# Patient Record
Sex: Male | Born: 1945 | Race: White | Hispanic: No | Marital: Married | Smoking: Current every day smoker
Health system: Southern US, Community
[De-identification: ages and names within clinical notes are randomized; demographics above are authoritative.]

## PROBLEM LIST (undated history)

## (undated) DIAGNOSIS — Z923 Personal history of irradiation: Secondary | ICD-10-CM

## (undated) DIAGNOSIS — E785 Hyperlipidemia, unspecified: Secondary | ICD-10-CM

## (undated) DIAGNOSIS — K5790 Diverticulosis of intestine, part unspecified, without perforation or abscess without bleeding: Secondary | ICD-10-CM

## (undated) DIAGNOSIS — T7840XA Allergy, unspecified, initial encounter: Secondary | ICD-10-CM

## (undated) DIAGNOSIS — M1990 Unspecified osteoarthritis, unspecified site: Secondary | ICD-10-CM

## (undated) DIAGNOSIS — I10 Essential (primary) hypertension: Secondary | ICD-10-CM

## (undated) DIAGNOSIS — Z9221 Personal history of antineoplastic chemotherapy: Secondary | ICD-10-CM

## (undated) DIAGNOSIS — C61 Malignant neoplasm of prostate: Secondary | ICD-10-CM

## (undated) HISTORY — DX: Allergy, unspecified, initial encounter: T78.40XA

## (undated) HISTORY — PX: MOUTH SURGERY: SHX715

## (undated) HISTORY — DX: Hyperlipidemia, unspecified: E78.5

## (undated) HISTORY — PX: REPLACEMENT TOTAL KNEE: SUR1224

## (undated) HISTORY — DX: Unspecified osteoarthritis, unspecified site: M19.90

## (undated) HISTORY — DX: Diverticulosis of intestine, part unspecified, without perforation or abscess without bleeding: K57.90

## (undated) HISTORY — DX: Personal history of antineoplastic chemotherapy: Z92.21

## (undated) HISTORY — DX: Essential (primary) hypertension: I10

## (undated) HISTORY — PX: COLONOSCOPY: SHX174

---

## 1999-05-16 ENCOUNTER — Emergency Department (HOSPITAL_COMMUNITY): Admission: EM | Admit: 1999-05-16 | Discharge: 1999-05-16 | Payer: Self-pay

## 1999-05-18 ENCOUNTER — Emergency Department (HOSPITAL_COMMUNITY): Admission: EM | Admit: 1999-05-18 | Discharge: 1999-05-18 | Payer: Self-pay | Admitting: Emergency Medicine

## 1999-05-22 ENCOUNTER — Ambulatory Visit (HOSPITAL_COMMUNITY): Admission: RE | Admit: 1999-05-22 | Discharge: 1999-05-22 | Payer: Self-pay | Admitting: Urology

## 1999-05-22 ENCOUNTER — Encounter: Payer: Self-pay | Admitting: Urology

## 2004-07-15 ENCOUNTER — Ambulatory Visit: Payer: Self-pay | Admitting: Gastroenterology

## 2004-07-26 ENCOUNTER — Ambulatory Visit: Payer: Self-pay | Admitting: Gastroenterology

## 2004-07-26 ENCOUNTER — Encounter (INDEPENDENT_AMBULATORY_CARE_PROVIDER_SITE_OTHER): Payer: Self-pay | Admitting: Specialist

## 2008-05-25 ENCOUNTER — Ambulatory Visit (HOSPITAL_COMMUNITY): Admission: RE | Admit: 2008-05-25 | Discharge: 2008-05-25 | Payer: Self-pay | Admitting: Orthopedic Surgery

## 2008-05-29 ENCOUNTER — Inpatient Hospital Stay (HOSPITAL_COMMUNITY): Admission: RE | Admit: 2008-05-29 | Discharge: 2008-06-01 | Payer: Self-pay | Admitting: Orthopedic Surgery

## 2009-07-04 ENCOUNTER — Encounter (INDEPENDENT_AMBULATORY_CARE_PROVIDER_SITE_OTHER): Payer: Self-pay | Admitting: *Deleted

## 2009-07-10 ENCOUNTER — Encounter (INDEPENDENT_AMBULATORY_CARE_PROVIDER_SITE_OTHER): Payer: Self-pay | Admitting: *Deleted

## 2009-08-02 ENCOUNTER — Encounter (INDEPENDENT_AMBULATORY_CARE_PROVIDER_SITE_OTHER): Payer: Self-pay | Admitting: *Deleted

## 2009-08-06 ENCOUNTER — Ambulatory Visit: Payer: Self-pay | Admitting: Gastroenterology

## 2009-08-20 ENCOUNTER — Ambulatory Visit: Payer: Self-pay | Admitting: Gastroenterology

## 2010-03-12 NOTE — Letter (Signed)
Summary: [facility] Instructions  [facility]  [address] [place], [place] [address]   Phone: [phone]  Fax: [phone]       Larry Rasmussen    27-May-1945    MRN: [id number]        Procedure Day Dorna Bloom:  Duanne Limerick  08/20/09     Arrival Time:  8:00AM     Procedure Time:  9:00AM     [facility] of Procedure:                    [facility]   PREPARATION FOR COLONOSCOPY WITH MOVIPREP   Starting 5 days prior to your procedure 08/15/09 do not eat nuts, seeds, popcorn, corn, beans, peas,  salads, or any raw vegetables.  Do not take any fiber supplements (e.g. Metamucil, Citrucel, and Benefiber).  THE DAY BEFORE YOUR PROCEDURE         DATE: 08/19/09  DAY: SUNDAY  1.  Drink clear liquids the entire day-NO SOLID FOOD  2.  Do not drink anything colored red or purple.  Avoid juices with pulp.  No orange juice.  3.  Drink at least 64 oz. (8 glasses) of fluid/clear liquids during the day to prevent dehydration and help the prep work efficiently.  CLEAR LIQUIDS INCLUDE: Water Jello Ice Popsicles Tea (sugar ok, no milk/cream) Powdered fruit flavored drinks Coffee (sugar ok, no milk/cream) Gatorade Juice: apple, white grape, white cranberry  Lemonade Clear bullion, consomm, broth Carbonated beverages (any kind) Strained chicken noodle soup Hard Candy                             4.  In the morning, mix first dose of MoviPrep solution:    Empty 1 Pouch A and 1 Pouch B into the disposable container    Add lukewarm drinking water to the top line of the container. Mix to dissolve    Refrigerate (mixed solution should be used within 24 hrs)  5.  Begin drinking the prep at 5:00 p.m. The MoviPrep container is divided by 4 marks.   Every 15 minutes drink the solution down to the next mark (approximately 8 oz) until the full liter is complete.   6.  Follow completed prep with 16 oz of clear liquid of your choice (Nothing red or purple).   Continue to drink clear liquids until bedtime.  7.  Before going to bed, mix second dose of MoviPrep solution:    Empty 1 Pouch A and 1 Pouch B into the disposable container    Add lukewarm drinking water to the top line of the container. Mix to dissolve    Refrigerate  THE DAY OF YOUR PROCEDURE      DATE: 08/20/09  DAY: MONDAY  Beginning at 4:00AM (5 hours before procedure):         1. Every 15 minutes, drink the solution down to the next mark (approx 8 oz) until the full liter is complete.  2. Follow completed prep with 16 oz. of clear liquid of your choice.    3. You may drink clear liquids until 7:00AM (2 HOURS BEFORE PROCEDURE).   MEDICATION INSTRUCTIONS  Unless otherwise instructed, you should take regular prescription medications with a small sip of water   as early as possible the morning of your procedure.          OTHER INSTRUCTIONS  You will need a responsible adult  at least 63 years of age to accompany you and drive you home.   This person must remain in the waiting room during your procedure.  Wear loose fitting clothing that is easily removed.  Leave jewelry and other valuables at home.  However, you may wish to bring a book to read or  an iPod/MP3 player to listen to music as you wait for your procedure to start.  Remove all body piercing jewelry and leave at home.  Total time from sign-in until discharge is approximately 2-3 hours.  You should go home directly after your procedure and rest.  You can resume normal activities the  day after your procedure.  The day of your procedure you should not:   Drive   Make legal decisions   Operate machinery   Drink alcohol   Return to work  You will receive specific instructions about eating, activities and medications before you leave.    The above instructions have been reviewed and explained to me by   Wyona Almas RN  August 06, 2009 4:31 PM     I fully understand and can verbalize these  instructions _____________________________ Date _________

## 2010-03-12 NOTE — Letter (Signed)
Summary: Colonoscopy Letter  [facility]  [address] [place], [place] [address]   Phone: [phone]  Fax: [phone]      Jul 04, 2009 MRN: [id number]   Larry Rasmussen [address] [place], [place]  [address]   Dear [name],   According to your medical record, it is time for you to schedule a Colonoscopy. The American Cancer Society recommends this procedure as a method to detect early colon cancer. Patients with a family history of colon cancer, or a personal history of colon polyps or inflammatory bowel disease are at increased risk.  This letter has beeen generated based on the recommendations made at the time of your procedure. If you feel that in your particular situation this may no longer apply, please contact our office.  Please call our office at [phone] to schedule this appointment or to update your records at your earliest convenience.  Thank you for cooperating with [place] to provide you with the very best care possible.   Sincerely,    Vania Rea. Jarold Motto, M.D.  [facility] [phone]

## 2010-03-12 NOTE — Procedures (Signed)
Summary: Colonoscopy  Patient: Zen Cedillos Note: All result statuses are Final unless otherwise noted.  Tests: (1) Colonoscopy (COL)   COL Colonoscopy           DONE     [facility]     [address]     [place], [place]  [address]           COLONOSCOPY PROCEDURE REPORT           PATIENT:  Naseer, Hearn  MR#:  [id number]     BIRTHDATE:  03/18/1945, 64 yrs. old  GENDER:  male     ENDOSCOPIST:  Vania Rea. Jarold Motto, MD, [facility]     REF. BY:     PROCEDURE DATE:  08/20/2009     PROCEDURE:  Surveillance Colonoscopy     ASA CLASS:  Class II     INDICATIONS:  history of pre-cancerous (adenomatous) colon polyps           MEDICATIONS:   Fentanyl 75 mcg IV, Versed 7 mg IV           DESCRIPTION OF PROCEDURE:   After the risks benefits and     alternatives of the procedure were thoroughly explained, informed     consent was obtained.  Digital rectal exam was performed and     revealed no abnormalities.   The LB CF-[id number] endoscope     was introduced through the anus and advanced to the cecum, which     was identified by both the appendix and ileocecal valve, without     limitations.  The quality of the prep was excellent, using     MoviPrep.  The instrument was then slowly withdrawn as the colon     was fully examined.     <<PROCEDUREIMAGES>>           FINDINGS:  Moderate diverticulosis was found in the sigmoid to     descending colon segments.  No polyps or cancers were seen.  This     was otherwise a normal examination of the colon.   Retroflexed     views in the rectum revealed no abnormalities.    The scope was     then withdrawn from the patient and the procedure completed.           COMPLICATIONS:  None     ENDOSCOPIC IMPRESSION:     1) Moderate diverticulosis in the sigmoid to descending colon     segments     2) No polyps or cancers     3) Otherwise normal examination     RECOMMENDATIONS:     1) Follow up colonoscopy in 5 years     REPEAT EXAM:   No           ______________________________     Vania Rea. Jarold Motto, MD, [facility]           CC:           n.     eSIGNED:   Vania Rea. Patterson at 08/20/2009 09:48 AM           Nile Riggs, [id number]  Note: An exclamation mark (!) indicates a result that was not dispersed into the flowsheet. Document Creation Date: 08/20/2009 9:48 AM _______________________________________________________________________  (1) Order result status: Final Collection or observation date-time: 08/20/2009 09:41 Requested date-time:  Receipt date-time:  Reported date-time:  Referring Physician:   Ordering Physician: Sheryn Bison [phone]) Specimen Source:  Source: Launa Grill Order Number: [id number] Lab  site:   Appended Document: Colonoscopy     Colonoscopy  Procedure date:  08/20/2009  Findings:      Location:  [facility].    Procedures Next Due Date:    Colonoscopy: 08/2014

## 2010-03-12 NOTE — Letter (Signed)
Summary: Previsit letter  [facility]  [address] [place], [place] [address]   Phone: [phone]  Fax: [phone]       07/10/2009 MRN: [id number]  Larry Rasmussen [address] [employer] [employer], [place]  [address]  Dear [name],  Welcome to the Gastroenterology Division at [facility].    You are scheduled to see a nurse for your pre-procedure visit on 08-06-09 at 10AM on the 3rd floor at [facility], [address]  We ask that you try to arrive at our office 15 minutes prior to your appointment time to allow for check-in.  Your nurse visit will consist of discussing your medical and surgical history, your immediate family medical history, and your medications.    Please bring a complete list of all your medications or, if you prefer, bring the medication bottles and we will list them.  We will need to be aware of both prescribed and over the counter drugs.  We will need to know exact dosage information as well.  If you are on blood thinners (Coumadin, Plavix, Aggrenox, Ticlid, etc.) please call our office today/prior to your appointment, as we need to consult with your physician about holding your medication.   Please be prepared to read and sign documents such as consent forms, a financial agreement, and acknowledgement forms.  If necessary, and with your consent, a friend or relative is welcome to sit-in on the nurse visit with you.  Please bring your insurance card so that we may make a copy of it.  If your insurance requires a referral to see a specialist, please bring your referral form from your primary care physician.  No co-pay is required for this nurse visit.     If you cannot keep your appointment, please call [phone] to cancel or reschedule prior to your appointment date.  This allows [place] the opportunity to schedule an appointment for another patient in need of care.    Thank you for choosing [facility] for your medical  needs.  We appreciate the opportunity to care for you.  Please visit [place] at our website  to learn more about our practice.                     Sincerely.                                                                                                                   The Gastroenterology Division

## 2010-03-12 NOTE — Miscellaneous (Signed)
Summary: [facility] Previsit/prep  Clinical Lists Changes  Medications: Added new medication of MOVIPREP 100 GM  SOLR (PEG-KCL-NACL-NASULF-NA ASC-C) As per prep instructions. - Signed Rx of MOVIPREP 100 GM  SOLR (PEG-KCL-NACL-NASULF-NA ASC-C) As per prep instructions.;  #1 x 0;  Signed;  Entered by: Wyona Almas RN;  Authorized by: Mardella Layman MD [facility];  Method used: [facility] [address] [phone]*, [address] [place], [place], [place]  [address], Ph: [phone] or [phone], Fax: [phone] Allergies: Added new allergy or adverse reaction of PENICILLIN Observations: Added new observation of NKA: F (08/06/2009 16:00)    Prescriptions: MOVIPREP 100 GM  SOLR (PEG-KCL-NACL-NASULF-NA ASC-C) As per prep instructions.  #1 x 0   Entered by:   Wyona Almas RN   Authorized by:   Mardella Layman MD [facility]   Signed by:   Wyona Almas RN on 08/06/2009   Method used:   [facility]        [facility] [address] [phone]* (retail)       [address]       [place], [place]  [address]       Ph: [phone] or [phone]       Fax: [phone]   RxID:   [id number]

## 2010-05-22 LAB — PROTIME-INR
INR: 1.2 (ref 0.00–1.49)
INR: 1.3 (ref 0.00–1.49)
Prothrombin Time: 15.1 seconds (ref 11.6–15.2)
Prothrombin Time: 19.8 seconds — ABNORMAL HIGH (ref 11.6–15.2)
Prothrombin Time: 13.7 seconds (ref 11.6–15.2)

## 2010-05-22 LAB — CBC
HCT: 29.7 % — ABNORMAL LOW (ref 39.0–52.0)
HCT: 30.2 % — ABNORMAL LOW (ref 39.0–52.0)
Hemoglobin: 10.6 g/dL — ABNORMAL LOW (ref 13.0–17.0)
Hemoglobin: 11.2 g/dL — ABNORMAL LOW (ref 13.0–17.0)
Hemoglobin: 15.7 g/dL (ref 13.0–17.0)
MCHC: 34.4 g/dL (ref 30.0–36.0)
MCV: 90.1 fL (ref 78.0–100.0)
Platelets: 158 10*3/uL (ref 150–400)
Platelets: 176 10*3/uL (ref 150–400)
RBC: 3.3 MIL/uL — ABNORMAL LOW (ref 4.22–5.81)
RBC: 3.55 MIL/uL — ABNORMAL LOW (ref 4.22–5.81)
RBC: 4.89 MIL/uL (ref 4.22–5.81)
RDW: 12.2 % (ref 11.5–15.5)
WBC: 5.7 10*3/uL (ref 4.0–10.5)
WBC: 6.2 10*3/uL (ref 4.0–10.5)
WBC: 6.6 10*3/uL (ref 4.0–10.5)
WBC: 6.6 10*3/uL (ref 4.0–10.5)

## 2010-05-22 LAB — URINALYSIS, ROUTINE W REFLEX MICROSCOPIC
Bilirubin Urine: NEGATIVE
Glucose, UA: NEGATIVE mg/dL
Hgb urine dipstick: NEGATIVE
Ketones, ur: NEGATIVE mg/dL
Protein, ur: NEGATIVE mg/dL

## 2010-05-22 LAB — ABO/RH
ABO/RH(D): O POS
ABO/RH(D): O POS

## 2010-05-22 LAB — COMPREHENSIVE METABOLIC PANEL
ALT: 51 U/L (ref 0–53)
AST: 27 U/L (ref 0–37)
Alkaline Phosphatase: 55 U/L (ref 39–117)
CO2: 27 mEq/L (ref 19–32)
Chloride: 104 mEq/L (ref 96–112)
GFR calc Af Amer: 57 mL/min — ABNORMAL LOW (ref >60)
GFR calc non Af Amer: 47 mL/min — ABNORMAL LOW (ref >60)
Glucose, Bld: 96 mg/dL (ref 70–99)
Sodium: 139 mEq/L (ref 135–145)
Total Bilirubin: 1.1 mg/dL (ref 0.3–1.2)

## 2010-05-22 LAB — DIFFERENTIAL
Basophils Absolute: 0 10*3/uL (ref 0.0–0.1)
Basophils Relative: 0 % (ref 0–1)
Eosinophils Absolute: 0.1 10*3/uL (ref 0.0–0.7)
Eosinophils Relative: 2 % (ref 0–5)
Neutrophils Relative %: 73 % (ref 43–77)

## 2010-05-22 LAB — BASIC METABOLIC PANEL
BUN: 20 mg/dL (ref 6–23)
CO2: 28 mEq/L (ref 19–32)
Calcium: 8.3 mg/dL — ABNORMAL LOW (ref 8.4–10.5)
Chloride: 104 mEq/L (ref 96–112)
Creatinine, Ser: 1.47 mg/dL (ref 0.4–1.5)
GFR calc Af Amer: 59 mL/min — ABNORMAL LOW (ref >60)
GFR calc non Af Amer: 49 mL/min — ABNORMAL LOW (ref >60)
Glucose, Bld: 143 mg/dL — ABNORMAL HIGH (ref 70–99)
Potassium: 4 mEq/L (ref 3.5–5.1)
Sodium: 137 mEq/L (ref 135–145)

## 2010-05-22 LAB — TYPE AND SCREEN
ABO/RH(D): O POS
ABO/RH(D): O POS
Antibody Screen: NEGATIVE

## 2010-06-25 NOTE — Op Note (Signed)
NAME:  Larry Rasmussen, Larry Rasmussen           ACCOUNT NO.:  [id number]   MEDICAL RECORD NO.:  [id number]          PATIENT TYPE:  INP   LOCATION:  0001                         FACILITY:  [facility]   PHYSICIAN:  Harvie Junior, M.D.   DATE OF BIRTH:  28-Sep-1945   DATE OF PROCEDURE:  05/29/2008  DATE OF DISCHARGE:                               OPERATIVE REPORT   PREOPERATIVE DIAGNOSIS:  End-stage degenerative joint disease right  knee.   POSTOPERATIVE DIAGNOSIS:  End-stage degenerative joint disease right  knee.   PRINCIPAL PROCEDURES:  1. Right total knee replacement with the Sigma system, size 5 femur,      size 5 tibia and a 41-mm all-poly patella, we used a 10-mm bridging      bearing.  2. Computer-assisted right total knee replacement.   SURGEON:  Harvie Junior, MD.   ASSISTANT:  Marshia Ly, PA.   ANESTHESIA:  General.   BRIEF HISTORY:  Mr. Borkenhagen is a 65 year old male with a long history  of having had significant degenerative joint disease of the right knee,  in particular on the lateral side.  He had been treated conservatively  for a long period time because of continuing complaints of significant  pain.  He was also taken to the operating room for right total knee  replacement.  Because of his young age and need for longevity of his  implant, we discussed and we are going to use computer-assisted  technology and this was chosen to be used for the case and was used  during the procedure.   PROCEDURE:  Patient taken to the operating room.  After adequate  anesthesia was obtained with general anesthetic, the patient was placed  supine on the operating room table.  The right leg was prepped and  draped in the usual sterile fashion.  Following this, the leg was  exsanguinated, a blood pressure tourniquet was inflated to 350 mmHg.  Following this, a midline incision was made in the subcutaneous tissue  down to the level of the extensor mechanism and medial parapatellar  arthrotomy was undertaken.  Once this was completed, attention was  turned towards the removal of the anterior and posterior cruciates,  medial and lateral meniscus were removed and the attention was then  turned to the computer-assisted technique where at this point the two  pins were placed in the tibia, two pins in the femur and the  registration process undertaken and adds about 30 minutes to the  surgical procedure.  At this point, attention was turned towards the  tibia which was cut perpendicular to its long axis under computer  assistance.  Following this, attention was turned towards the femur and  this was cut perpendicular to the anatomic axis.  Spacer blocks were put  in place at this point and adequate extension gap had been achieved.  Following this, attention was turned towards the femur which was sized  to a size 5, and 3 degrees of external rotation were used to account for  the internal rotation, relatively speaking, of the guide.  Once this was  placed, the attention was then turned towards  the femoral cutting blocks  where anterior and posterior cuts were made as well as chamfers,  excellent cuts were made here and good placement and fit in the front.  Once this was completed, attention was turned towards the box cut and  the box cut was made centrally at a size 5.  Attention was turned to the  tibia which was exposed and the size 5 was then hammered into place in  the tibia.  At this point, the central peg was drilled and the keel was  placed and then the 10-mm bridging bearing trial was placed and the 5  femur was placed and the lugs were drilled in the 5 and then the leg was  brought out to a full extension and came easily into full extension.  Computer assistance was used at this point to check the full extension  and we could see easy full extension.  Once that was completed, the  attention was turned to the patella which was then cut with a patellar  guide down to  a level of 14 and the 41 all-poly patella was chosen at  this point and lugs were drilled for the patella.  Lugs were redrilled  or checked for the femur.  At this point, all trial components were  removed, the knee was copiously and thoroughly irrigated with normal  saline irrigation and suctioned dry and the final components were then  cemented into place, a size 5 femur, size 5 tibia and a 10-mm bridging  bearing trial was placed and a 41 all-poly patella was put in place.  At  this point, the cement was allowed to harden, computer assistance was  checked at this point with perfect neutral long alignments with perfect  gap balance in both flexion and extension.  Once this was completed,  attention was turned towards allowing the cement to dry.  When the  cement was hardened, the excess cement was removed with a cement tool  and the final check was made with the computer assistance.  Perfect  neutral long alignments, perfect gap balance.  Once this was completed,  the tourniquet was let down, all bleeders were controlled with  electrocautery, the final 10 poly was placed and final check for the  range of motion.  A medium Hemovac drain was placed and the parapatellar  arthrotomy was closed with a 1 Vicryl running, the skin with 0 and 2-0  Vicryl and skin staples.  A surgical occlusive dressing was applied as  well as knee immobilizer.  The patient taken to the recovery room where  he was noted to be in satisfactory condition.  Estimated blood loss for  procedure was less 100 mL.      Harvie Junior, M.D.  Electronically Signed     JLG/MEDQ  D:  05/29/2008  T:  05/29/2008  Job:  [id number]

## 2010-06-28 NOTE — Discharge Summary (Signed)
Larry Rasmussen, Larry Rasmussen           ACCOUNT NO.:  [id number]   MEDICAL RECORD NO.:  [id number]          PATIENT TYPE:  INP   LOCATION:  1608                         FACILITY:  [facility]   PHYSICIAN:  Harvie Junior, M.D.   DATE OF BIRTH:  1945/07/20   DATE OF ADMISSION:  05/29/2008  DATE OF DISCHARGE:  06/01/2008                               DISCHARGE SUMMARY   ADMISSION DIAGNOSES:  1. End stage degenerative joint disease right knee.  2. Hypertension.  3. Tobacco dependence.   DISCHARGE DIAGNOSES:  1. End stage degenerative joint disease right knee.  2. Hypertension.  3. Tobacco dependence.   PROCEDURE:  Right total knee arthroplasty, computer assisted, Jodi Geralds, M.D., May 29, 2008.   HISTORY OF PRESENT ILLNESS:  Larry Rasmussen is a 65 year old male who  has a long history of right knee pain.  He has night pain and pain with  ambulation.  Standing x-rays of the right knee shows that he has bone-on-  bone DJD. He has not improved with exhaustive conservative treatment  including injection therapy, modification of activities, and medication.  Based upon his clinical and radiographic findings he is felt to be a  candidate for a right total knee arthroplasty and is admitted for this.   LABORATORY DATA:  Hemoglobin on admission was stable and is not able to  be located at this time. Hemoglobin on postop day one was 11.2,  hematocrit of 32.4. Hemoglobin on postop day two was 10.6 and on postop  day three, 10.6.  Protime was 15.1 seconds with an INR of 1.2 on the day  of discharge. On Coumadin therapy his INR was 1.6 with a protime of 19.8  seconds.  B-met on postop day #1 showed no abnormalities other than  elevated glucose of 143.   HOSPITAL COURSE:  The patient was brought to the operating room and  underwent a right total knee arthroplasty as well described in Dr.  Luiz Blare' operative note on 05/29/2008. Preoperatively he was given  Cleocin 300 mg IV and gentamicin 80 mg IV.  Postop was given clindamycin  300 mg IV q.8 hours x3 doses. A PCA morphine pump was used for pain  control.  Physical therapy was ordered for walker ambulation and  weightbearing as tolerated on the right.  IV fluids were instituted as  well.  Foley catheter was placed at time of surgery.  Postop day #1 he  complained of moderate right knee pain.  He was using the morphine.  Denied nausea and vomiting.  His Foley catheter was removed and he had  not voided when he was seen but he was able to do this later in the day.  Hemoglobin was stable at 11.2.  His B-met was within normal limits.  INR  was 1.2.  He got out of bed with physical therapy and incentive  spirometry was encouraged.  On postop day #2 he had moderate knee pain.  He is taking fluids and voiding without difficulties, progressing with  physical therapy.  His vital signs were stable, afebrile, right knee was  benign.  Hemoglobin was 10.6, INR 1.3.  His  PCA morphine pump was  discontinued and his IV was converted to a saline lock.  His dressing  was changed.  His Hemovac drain was pulled.  On postop day #3 his right  knee pain was improved.  He was progressing well with physical therapy.  He was on taking p.o. and had flatus but had not had a BM yet.  He was  afebrile and vital signs were stable.  Hemoglobin was 10.6 and INR was  1.6.  His saline lock was discontinued. He was discharged home in  improved condition. Was on a regular diet. Was instructed to ambulate  weightbearing as tolerated with a walker. He was given Percocet 5 mg prn  for pain, Robaxin 750 mg p.r.n. for spasm  and Coumadin one daily as directed per pharmacy, for 1 month for DVT  prophylaxis. He will need home health physical therapy and home health  RN for protimes and Coumadin management.  He will follow up with Dr.  Luiz Blare in 10 to 12 days in the office. Call sooner if he has any  problems.      Marshia Ly, P.A.      Harvie Junior, M.D.   Electronically Signed    JB/MEDQ  D:  07/06/2008  T:  07/06/2008  Job:  [id number]

## 2013-09-15 ENCOUNTER — Other Ambulatory Visit (HOSPITAL_COMMUNITY): Payer: Self-pay | Admitting: Urology

## 2013-09-15 DIAGNOSIS — R972 Elevated prostate specific antigen [PSA]: Secondary | ICD-10-CM

## 2013-09-29 ENCOUNTER — Encounter: Payer: Self-pay | Admitting: Gastroenterology

## 2013-10-07 ENCOUNTER — Ambulatory Visit (HOSPITAL_COMMUNITY)
Admission: RE | Admit: 2013-10-07 | Discharge: 2013-10-07 | Disposition: A | Discharge status: Home / Self Care | Payer: Medicare Other | Source: Ambulatory Visit | Attending: Urology | Admitting: Urology

## 2013-10-07 DIAGNOSIS — R599 Enlarged lymph nodes, unspecified: Secondary | ICD-10-CM | POA: Diagnosis not present

## 2013-10-07 DIAGNOSIS — R972 Elevated prostate specific antigen [PSA]: Secondary | ICD-10-CM | POA: Diagnosis not present

## 2013-10-07 LAB — CREATININE, SERUM
Creatinine, Ser: 1.46 mg/dL — ABNORMAL HIGH (ref 0.50–1.35)
GFR calc non Af Amer: 48 mL/min — ABNORMAL LOW (ref >90)
GFR, EST AFRICAN AMERICAN: 56 mL/min — AB (ref >90)

## 2013-10-07 MED ORDER — GADOBENATE DIMEGLUMINE 529 MG/ML IV SOLN
20.0000 mL | Freq: Once | INTRAVENOUS | Status: AC | PRN
  Administered 2013-10-07: 20 mL via INTRAVENOUS

## 2013-10-25 DIAGNOSIS — C61 Malignant neoplasm of prostate: Secondary | ICD-10-CM

## 2013-10-25 HISTORY — DX: Malignant neoplasm of prostate: C61

## 2013-10-25 HISTORY — PX: PROSTATE BIOPSY: SHX241

## 2013-12-14 ENCOUNTER — Encounter: Payer: Self-pay | Admitting: Radiation Oncology

## 2013-12-14 NOTE — Progress Notes (Signed)
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  If Prostate Cancer, Gleason Score is (4 + 4) and PSA is (7.26 on 03/10/13) 09/01/13 PSA 9.51  Fuller Song presented 2011 with signs/symptoms of: elevated PSA 7.4, biopsy negative  Biopsies of prostate (if applicable) revealed:  1/56/15  Vol 74 gm   Past/Anticipated interventions by urology, if any: biopsy x 2, PSA surveillance  Past/Anticipated interventions by medical oncology, if any: no  Weight changes, if any: no  Bowel/Bladder complaints, if any:  IPSS 11, freq, intermittency, weak stream, straining, nocturia x 2  Nausea/Vomiting, if any: no  Pain issues, if any:  no  SAFETY ISSUES:  Prior radiation? no  Pacemaker/ICD? no  Possible current pregnancy? na  Is the patient on methotrexate? no  Current Complaints / other details:  Married, retired Customer service manager, one child Dr Risa Grill: reasonable candidate for prostatectomy or xrt w/6 mos concurrent androgen deprivation therapy. Pt leaning towards radiation.

## 2013-12-15 ENCOUNTER — Telehealth: Payer: Self-pay | Admitting: *Deleted

## 2013-12-15 ENCOUNTER — Ambulatory Visit
Admission: RE | Admit: 2013-12-15 | Discharge: 2013-12-15 | Disposition: A | Discharge status: Home / Self Care | Payer: Medicare Other | Source: Ambulatory Visit | Attending: Radiation Oncology | Admitting: Radiation Oncology

## 2013-12-15 ENCOUNTER — Encounter: Payer: Self-pay | Admitting: Radiation Oncology

## 2013-12-15 VITALS — BP 154/87 | HR 70 | Temp 98.6°F | Resp 20 | Ht 72.0 in | Wt 262.7 lb

## 2013-12-15 DIAGNOSIS — Z510 Encounter for antineoplastic radiation therapy: Secondary | ICD-10-CM | POA: Diagnosis present

## 2013-12-15 DIAGNOSIS — C61 Malignant neoplasm of prostate: Secondary | ICD-10-CM | POA: Diagnosis not present

## 2013-12-15 HISTORY — DX: Malignant neoplasm of prostate: C61

## 2013-12-15 NOTE — Telephone Encounter (Signed)
CALLED PATIENT TO INFORM OF GOLD SEED PLACEMENT ON 01-19-14- ARRIVAL TIME - 8 AM  @ DR. GRAPEY'S OFFICE AND HIS [place] VISIT ON 02-16-14- ARRIVAL TIME - 9:30 AM, SPOKE WITH PATIENT AND HE IS AWARE OF THESE APPTS.

## 2013-12-15 NOTE — Progress Notes (Signed)
Please see the Nurse Progress Note in the MD Initial Consult Encounter for this patient. 

## 2013-12-15 NOTE — Progress Notes (Signed)
[facility] NEW PATIENT EVALUATION  Name: Larry Rasmussen MRN: [id number]  Date:   12/15/2013           DOB: 1945/10/15  Status: outpatient   CC: No primary care provider on file.  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 male who is seen today through the courtesy of Dr. Rana Snare for evaluation of his  stageT1c high risk adenocarcinoma prostate. I understand that he had a PSA of 7.4 back in 2011 and biopsies were nondiagnostic. He has been followed at the [place] and he tells me that his PSA rose to  9.51 this past 09/01/2013. Dr. Risa Grill performed a MRI scan on 10/07/2013 which showed 2 lesions in the left peripheral zone, concerning for high-grade carcinoma. One lesion was in the left base and a second in the left lateral mid gland. Ultrasound-guided biopsies on 10/25/2013 were diagnostic for Gleason 8 (4+4) involving 70% of one core from the left lateral apex and 20% of one core from left apex. He had Gleason 7 (4+3) involving 30% of one core of 2 biopsies, and 5% of one core from the left mid gland.  His gland volume was approximately 74 mL He is doing reasonably well from a GU and GI standpoint. His I PSS score was 11. He does have erectile dysfunction which improves with generic Viagra.  PREVIOUS RADIATION THERAPY: No   PAST MEDICAL HISTORY:  has a past medical history of Prostate cancer (10/25/13).     PAST SURGICAL HISTORY:  Past Surgical History  Procedure Laterality Date  . Replacement total knee    . Mouth surgery    . Prostate biopsy  10/25/13    gleason 8, vol 74 gm     FAMILY HISTORY: family history includes Cancer in his mother; Hypertension in his father; Kidney Stones in his father; Lung cancer in his father. His father died of lung cancer at age 34. His mother died of some type of malignancy in  age 47. No family history of prostate cancer.   SOCIAL HISTORY:  reports that he has been smoking Cigars.  He quit smokeless tobacco use about 25 years ago. His smokeless tobacco use included Chew. He reports that he drinks alcohol. He reports that he does not use illicit drugs. married, one child. 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   ALLERGIES: Penicillins   MEDICATIONS:  Current Outpatient Prescriptions  Medication Sig Dispense Refill  . AMLODIPINE BESYLATE PO Take 10 mg by mouth daily.    Marland Kitchen aspirin 81 MG tablet Take 81 mg by mouth daily.    . Omega-3 Fatty Acids (FISH OIL) 1000 MG CAPS Take by mouth.    . simvastatin (ZOCOR) 20 MG tablet Take 20 mg by mouth daily.    Marland Kitchen VITAMIN D, ERGOCALCIFEROL, PO Take by mouth.     No current facility-administered medications for this encounter.     REVIEW OF SYSTEMS:  Pertinent items are noted in HPI.    PHYSICAL EXAM:  height is 6' (1.829 m) and weight is 262 lb 11.2 oz (119.16 kg). His temperature is 98.6 F (37 C). His blood pressure is 154/87 and his pulse is 70. His respiration is 20.   Head and neck examination: Grossly unremarkable. Nodes: Without palpable cervical or supraclavicular lymphadenopathy. Chest: Lungs clear. Back:  Without spinal or CVA tenderness. Abdomen: Without hepatomegaly. Rectal: The prostate gland is slightly enlarged and is without focal induration or nodularity. Extremities: Without edema.   LABORATORY DATA:  Lab Results  Component Value Date   WBC 6.2 06/01/2008   HGB 10.6* 06/01/2008   HCT 29.7* 06/01/2008   MCV 90.1 06/01/2008   PLT 176 06/01/2008   Lab Results  Component Value Date   NA 137 05/30/2008   K 4.0 05/30/2008   CL 104 05/30/2008   CO2 28 05/30/2008   Lab Results  Component Value Date   ALT 51 05/25/2008   AST 27 05/25/2008   ALKPHOS 55 05/25/2008   BILITOT 1.1 05/25/2008   PSA 9.51 from 09/01/2013   IMPRESSION: stage T1c high risk adenocarcinoma prostate. I explained to  the patient and his wife that his prognosis is related to his stage, PSA level, and Gleason score. His PSA level and stage are favorable while his Gleason score of 8 is distinctly unfavorable. We discussed surgery versus radiation therapy. Radiation therapy options include 5 weeks of external beam followed by seed implantation or 8 weeks of external beam/IMRT. We discussed the potential acute and late toxicities of radiation therapy. We also discussed the need for androgen deprivation therapy for 2 years with either radiation therapy option. We discussed bladder filling to minimize urinary toxicity. After lengthy discussion he is most interested in external beam/IMRT which I think would be a good choice for him. He would like to avoid surgery. I will call Dr. Cy Blamer office for initiation of androgen deprivation therapy and then get him scheduled for placement of 3 gold seed markers sometime within the next 2 months. I will see him for a follow-up visit in 2 months.   PLAN: as discussed above.  I spent 60  minutes face to face with the patient and more than 50% of that time was spent in counseling and/or coordination of care.

## 2014-02-16 ENCOUNTER — Encounter: Payer: Self-pay | Admitting: Radiation Oncology

## 2014-02-16 ENCOUNTER — Ambulatory Visit
Admission: RE | Admit: 2014-02-16 | Discharge: 2014-02-16 | Disposition: A | Discharge status: Home / Self Care | Payer: Medicare Other | Source: Ambulatory Visit | Attending: Radiation Oncology | Admitting: Radiation Oncology

## 2014-02-16 VITALS — BP 160/76 | HR 60 | Temp 98.2°F | Ht 72.0 in | Wt 259.9 lb

## 2014-02-16 DIAGNOSIS — Z510 Encounter for antineoplastic radiation therapy: Secondary | ICD-10-CM | POA: Diagnosis not present

## 2014-02-16 DIAGNOSIS — C61 Malignant neoplasm of prostate: Secondary | ICD-10-CM | POA: Insufficient documentation

## 2014-02-16 NOTE — Addendum Note (Signed)
Encounter addended by: Jenene Slicker, RN on: 02/16/2014 11:59 AM<BR>     Documentation filed: Inpatient Document Flowsheet

## 2014-02-16 NOTE — Progress Notes (Signed)
Larry Rasmussen had Gold Seed Markers  Placed on 01/19/14 and has received 2 Anti-androgen injections on 12/16/13 and 01/19/14.  Next injection will be on 02/21/14.

## 2014-02-16 NOTE — Addendum Note (Signed)
Encounter addended by: Deirdre Evener, RN on: 02/16/2014  6:16 PM<BR>     Documentation filed: Charges VN

## 2014-02-16 NOTE — Progress Notes (Signed)
CC: Dr. Rana Snare  Follow-up note:  Diagnosis: Stage TIc high-risk adenocarcinoma prostate  History: Larry Rasmussen is a most pleasant 69 year old male who is seen today for review and scheduling of his radiation therapy in the management of his stage TIc high-risk adenocarcinoma prostate.  I first saw the patient in consultation on 12/15/2013. I understand that he had a PSA of 7.4 back in 2011 and biopsies were nondiagnostic. He has been followed at the [place] and he tells me that his PSA rose to 9.51 this past 09/01/2013. Dr. Risa Grill performed a MRI scan on 10/07/2013 which showed 2 lesions in the left peripheral zone, concerning for high-grade carcinoma. One lesion was in the left base and a second in the left lateral mid gland. Ultrasound-guided biopsies on 10/25/2013 were diagnostic for Gleason 8 (4+4) involving 70% of one core from the left lateral apex and 20% of one core from left apex. He had Gleason 7 (4+3) involving 30% of one core of 2 biopsies, and 5% of one core from the left mid gland. His gland volume was approximately 74 mL He is doing reasonably well from a GU and GI standpoint.  His I PSS score today remains at 11.  He does have erectile dysfunction.  He began androgen deprivation therapy on November 6.  His next injection is on January 16.  He had gold seeds placed on December 10.  He does report hot flashes but only minimal fatigue.  As expected, he has lost his sex drive.  Physical examination: Alert and oriented. Filed Vitals:   02/16/14 0927  BP: 160/76  Pulse: 60  Temp: 98.2 F (36.8 C)   Rectal examination not performed today.  Impression: Stage TIc high-risk adenocarcinoma prostate.  We again discussed management options.  We discussed the CT simulation process and my desire to have a comfortably full bladder to minimize urinary toxicity.  We discussed the potential acute and late toxicities of radiation therapy, and consent is signed today.  We'll have him return later  this month for CT simulation and begin his radiation therapy in early February.  Plan: As above.  30 minutes was spent face-to-face with the patient, primarily counseling the patient and coordinating his care.

## 2014-03-02 ENCOUNTER — Ambulatory Visit
Admission: RE | Admit: 2014-03-02 | Discharge: 2014-03-02 | Disposition: A | Discharge status: Home / Self Care | Payer: Medicare Other | Source: Ambulatory Visit | Attending: Radiation Oncology | Admitting: Radiation Oncology

## 2014-03-02 DIAGNOSIS — C61 Malignant neoplasm of prostate: Secondary | ICD-10-CM

## 2014-03-02 DIAGNOSIS — Z510 Encounter for antineoplastic radiation therapy: Secondary | ICD-10-CM | POA: Diagnosis not present

## 2014-03-02 NOTE — Progress Notes (Signed)
Complex simulation/treatment planning note: The patient was taken to the CT simulator.  He was placed supine.  A Vac lock immobilization device was constructed.  A red rubber tube was placed within the rectal vault.  He was then catheterized and contrast instilled into the bladder/urethra.  He was then scanned.  I chose an isocenter along the center of the prostate.  The CT data set was sent to the  MIM planning system I contoured his prostate, seminal vesicles, bladder, rectum, and lower rectosigmoid colon.  I'm prescribing 7800 cGy in 40 sessions to his prostate PTV which represents the prostate +0.8 cm except for 0.5 cm along the rectum.  I prescribing 5600 cGy in 40 sessions to his seminal vesicle PTV which  represents the seminal vesicles +0.5 cm.  He is now ready for IMRT simulation/treatment planning.

## 2014-03-06 ENCOUNTER — Encounter: Payer: Self-pay | Admitting: Radiation Oncology

## 2014-03-06 DIAGNOSIS — Z510 Encounter for antineoplastic radiation therapy: Secondary | ICD-10-CM | POA: Diagnosis not present

## 2014-03-06 NOTE — Progress Notes (Signed)
IMRT simulation/treatment planning note: The patient completed his IMRT simulation/treatment planning in the management of his carcinoma the prostate.  IMRT was chosen to decrease the risk for both acute and late bladder and rectal toxicity compared to conventional or 3-D conformal radiation therapy.  Dose volume histograms were obtained for the target structures including the prostate and seminal vesicles in addition to avoidance structures including the bladder, rectum, and femoral heads.  We met our departmental guidelines.  He is being treated with 6 MV photons, dual VMAT IMRT.

## 2014-03-07 DIAGNOSIS — Z510 Encounter for antineoplastic radiation therapy: Secondary | ICD-10-CM | POA: Diagnosis not present

## 2014-03-13 ENCOUNTER — Ambulatory Visit
Admission: RE | Admit: 2014-03-13 | Discharge: 2014-03-13 | Disposition: A | Discharge status: Home / Self Care | Payer: Medicare Other | Source: Ambulatory Visit | Attending: Radiation Oncology | Admitting: Radiation Oncology

## 2014-03-13 ENCOUNTER — Ambulatory Visit
Admission: RE | Admit: 2014-03-13 | Discharge: 2014-03-13 | Disposition: A | Discharge status: Home / Self Care | Payer: Non-veteran care | Source: Ambulatory Visit | Attending: Radiation Oncology | Admitting: Radiation Oncology

## 2014-03-13 ENCOUNTER — Encounter: Payer: Self-pay | Admitting: Radiation Oncology

## 2014-03-13 VITALS — BP 169/89 | HR 60 | Temp 98.2°F | Resp 12 | Wt 262.3 lb

## 2014-03-13 DIAGNOSIS — C61 Malignant neoplasm of prostate: Secondary | ICD-10-CM

## 2014-03-13 DIAGNOSIS — Z510 Encounter for antineoplastic radiation therapy: Secondary | ICD-10-CM | POA: Diagnosis not present

## 2014-03-13 NOTE — Progress Notes (Signed)
He is currently in no pain.  Pt complains of loss of sleep Pt reports urinary frequency, hesistency and hot flashes. Pt states they urinate 3 - 4 times per night.  Pt reports soft bowel movement everyday. BP 169/89 mmHg  Pulse 60  Temp(Src) 98.2 F (36.8 C) (Oral)  Resp 12  Wt 262 lb 4.8 oz (118.978 kg)  SpO2 100%

## 2014-03-13 NOTE — Progress Notes (Signed)
Chart note: Mr. Sawa began his VMAT IMRT today in the management of his prostate cancer.  He is being treated with dual ARC/dynamic MLCs corresponding to one set of IMRT treatment devices [phone]).

## 2014-03-13 NOTE — Progress Notes (Signed)
Weekly Management Note:  Site: Prostate  Current Dose:  195  cGy Projected Dose: 7800  cGy  Narrative: The patient is seen today for routine under treatment assessment. CBCT/MVCT images/port films were reviewed. The chart was reviewed.   Bladder filling is satisfactory.  No new GU or GI difficulties.  Physical Examination:  Filed Vitals:   03/13/14 1100  BP: 169/89  Pulse: 60  Temp: 98.2 F (36.8 C)  Resp: 12  .  Weight: 262 lb 4.8 oz (118.978 kg).  No change.  Impression: Tolerating radiation therapy well.  Plan: Continue radiation therapy as planned.

## 2014-03-14 ENCOUNTER — Ambulatory Visit
Admission: RE | Admit: 2014-03-14 | Discharge: 2014-03-14 | Disposition: A | Discharge status: Home / Self Care | Payer: Medicare Other | Source: Ambulatory Visit | Attending: Radiation Oncology | Admitting: Radiation Oncology

## 2014-03-14 DIAGNOSIS — Z510 Encounter for antineoplastic radiation therapy: Secondary | ICD-10-CM | POA: Diagnosis not present

## 2014-03-15 ENCOUNTER — Ambulatory Visit
Admission: RE | Admit: 2014-03-15 | Discharge: 2014-03-15 | Disposition: A | Discharge status: Home / Self Care | Payer: Medicare Other | Source: Ambulatory Visit | Attending: Radiation Oncology | Admitting: Radiation Oncology

## 2014-03-15 DIAGNOSIS — Z510 Encounter for antineoplastic radiation therapy: Secondary | ICD-10-CM | POA: Diagnosis not present

## 2014-03-16 ENCOUNTER — Ambulatory Visit
Admission: RE | Admit: 2014-03-16 | Discharge: 2014-03-16 | Disposition: A | Discharge status: Home / Self Care | Payer: Medicare Other | Source: Ambulatory Visit | Attending: Radiation Oncology | Admitting: Radiation Oncology

## 2014-03-16 DIAGNOSIS — Z510 Encounter for antineoplastic radiation therapy: Secondary | ICD-10-CM | POA: Diagnosis not present

## 2014-03-17 ENCOUNTER — Ambulatory Visit
Admission: RE | Admit: 2014-03-17 | Discharge: 2014-03-17 | Disposition: A | Discharge status: Home / Self Care | Payer: Medicare Other | Source: Ambulatory Visit | Attending: Radiation Oncology | Admitting: Radiation Oncology

## 2014-03-17 DIAGNOSIS — Z510 Encounter for antineoplastic radiation therapy: Secondary | ICD-10-CM | POA: Diagnosis not present

## 2014-03-20 ENCOUNTER — Ambulatory Visit
Admission: RE | Admit: 2014-03-20 | Discharge: 2014-03-20 | Disposition: A | Discharge status: Home / Self Care | Payer: Medicare Other | Source: Ambulatory Visit | Attending: Radiation Oncology | Admitting: Radiation Oncology

## 2014-03-20 ENCOUNTER — Encounter: Payer: Self-pay | Admitting: Radiation Oncology

## 2014-03-20 VITALS — BP 133/77 | HR 69 | Temp 98.6°F | Ht 72.0 in | Wt 265.9 lb

## 2014-03-20 DIAGNOSIS — Z510 Encounter for antineoplastic radiation therapy: Secondary | ICD-10-CM | POA: Diagnosis not present

## 2014-03-20 DIAGNOSIS — C61 Malignant neoplasm of prostate: Secondary | ICD-10-CM

## 2014-03-20 MED ORDER — TAMSULOSIN HCL 0.4 MG PO CAPS: 0.4000 mg | ORAL_CAPSULE | Freq: Every day | ORAL | Status: AC

## 2014-03-20 NOTE — Progress Notes (Signed)
Weekly Management Note:  Site: Prostate Current Dose:  1170  cGy Projected Dose: 7800  cGy  Narrative: The patient is seen today for routine under treatment assessment. CBCT/MVCT images/port films were reviewed. The chart was reviewed.   Bladder filling is satisfactory.  He does report worsening urinary frequency with some slowing of his stream.  Mild burning on initiation of his urinary stream.  Otherwise no significant GU or GI difficulties.  He does have mild fatigue.  Physical Examination:  Filed Vitals:   03/20/14 1037  BP: 133/77  Pulse: 69  Temp: 98.6 F (37 C)  .  Weight: 265 lb 14.4 oz (120.611 kg).  No change.  Impression: Tolerating radiation therapy well, however, he does have some slowing of the stream, I think he would benefit from tamsulosin.  Plan: Continue radiation therapy as planned.  Start tamsulosin.

## 2014-03-20 NOTE — Progress Notes (Signed)
Larry Rasmussen has received 6 fractions to his pelvis for prostate cancer.  He reports that he is experiencing burning upon urination and frequent nocturia ~ 5 times nightly. He states he feels as if he is emptying completely and does not have any leakage after voiding.  Reports that urine clear.   Denies change in bowel habits and denies proctitis. He admits to "some" fatigue.  Continues to walk 2 miles daily.

## 2014-03-21 ENCOUNTER — Ambulatory Visit
Admission: RE | Admit: 2014-03-21 | Discharge: 2014-03-21 | Disposition: A | Discharge status: Home / Self Care | Payer: Medicare Other | Source: Ambulatory Visit | Attending: Radiation Oncology | Admitting: Radiation Oncology

## 2014-03-21 DIAGNOSIS — Z510 Encounter for antineoplastic radiation therapy: Secondary | ICD-10-CM | POA: Diagnosis not present

## 2014-03-22 ENCOUNTER — Ambulatory Visit
Admission: RE | Admit: 2014-03-22 | Discharge: 2014-03-22 | Disposition: A | Discharge status: Home / Self Care | Payer: Medicare Other | Source: Ambulatory Visit | Attending: Radiation Oncology | Admitting: Radiation Oncology

## 2014-03-22 DIAGNOSIS — Z510 Encounter for antineoplastic radiation therapy: Secondary | ICD-10-CM | POA: Diagnosis not present

## 2014-03-23 ENCOUNTER — Ambulatory Visit
Admission: RE | Admit: 2014-03-23 | Discharge: 2014-03-23 | Disposition: A | Discharge status: Home / Self Care | Payer: Medicare Other | Source: Ambulatory Visit | Attending: Radiation Oncology | Admitting: Radiation Oncology

## 2014-03-23 DIAGNOSIS — Z510 Encounter for antineoplastic radiation therapy: Secondary | ICD-10-CM | POA: Diagnosis not present

## 2014-03-24 ENCOUNTER — Ambulatory Visit
Admission: RE | Admit: 2014-03-24 | Discharge: 2014-03-24 | Disposition: A | Discharge status: Home / Self Care | Payer: Medicare Other | Source: Ambulatory Visit | Attending: Radiation Oncology | Admitting: Radiation Oncology

## 2014-03-24 DIAGNOSIS — Z510 Encounter for antineoplastic radiation therapy: Secondary | ICD-10-CM | POA: Diagnosis not present

## 2014-03-27 ENCOUNTER — Ambulatory Visit
Admission: RE | Admit: 2014-03-27 | Discharge: 2014-03-27 | Disposition: A | Discharge status: Home / Self Care | Payer: Non-veteran care | Source: Ambulatory Visit | Attending: Radiation Oncology | Admitting: Radiation Oncology

## 2014-03-27 ENCOUNTER — Ambulatory Visit
Admission: RE | Admit: 2014-03-27 | Discharge: 2014-03-27 | Disposition: A | Discharge status: Home / Self Care | Payer: Medicare Other | Source: Ambulatory Visit | Attending: Radiation Oncology | Admitting: Radiation Oncology

## 2014-03-27 ENCOUNTER — Encounter: Payer: Self-pay | Admitting: Radiation Oncology

## 2014-03-27 VITALS — BP 136/71 | HR 72 | Temp 97.8°F | Resp 20 | Wt 266.6 lb

## 2014-03-27 DIAGNOSIS — C61 Malignant neoplasm of prostate: Secondary | ICD-10-CM

## 2014-03-27 DIAGNOSIS — Z510 Encounter for antineoplastic radiation therapy: Secondary | ICD-10-CM | POA: Diagnosis not present

## 2014-03-27 NOTE — Progress Notes (Signed)
Weekly rad txs prostate, 11/40 completed, no hematuria, no dysuria, fos have increased frequency and urgency but better after taking flomax stated only up 2cx last night, HOH, , appetite good, drinking plenty water 10:33 AM

## 2014-03-27 NOTE — Progress Notes (Signed)
Weekly Management Note:  Site: Prostate Current Dose:  2145  cGy Projected Dose: 7800  cGy  Narrative: The patient is seen today for routine under treatment assessment. CBCT/MVCT images/port films were reviewed. The chart was reviewed.   Bladder filling is excellent.  His urination is improved with tamsulosin.  No GI difficulties.  Physical Examination:  Filed Vitals:   03/27/14 1035  BP: 136/71  Pulse: 72  Temp: 97.8 F (36.6 C)  Resp: 20  .  Weight: 266 lb 9.6 oz (120.929 kg).  No change.  Impression: Tolerating radiation therapy well.  Plan: Continue radiation therapy as planned.

## 2014-03-28 ENCOUNTER — Ambulatory Visit
Admission: RE | Admit: 2014-03-28 | Discharge: 2014-03-28 | Disposition: A | Discharge status: Home / Self Care | Payer: Medicare Other | Source: Ambulatory Visit | Attending: Radiation Oncology | Admitting: Radiation Oncology

## 2014-03-28 DIAGNOSIS — Z510 Encounter for antineoplastic radiation therapy: Secondary | ICD-10-CM | POA: Diagnosis not present

## 2014-03-29 ENCOUNTER — Ambulatory Visit
Admission: RE | Admit: 2014-03-29 | Discharge: 2014-03-29 | Disposition: A | Discharge status: Home / Self Care | Payer: Medicare Other | Source: Ambulatory Visit | Attending: Radiation Oncology | Admitting: Radiation Oncology

## 2014-03-29 DIAGNOSIS — Z510 Encounter for antineoplastic radiation therapy: Secondary | ICD-10-CM | POA: Diagnosis not present

## 2014-03-30 ENCOUNTER — Ambulatory Visit
Admission: RE | Admit: 2014-03-30 | Discharge: 2014-03-30 | Disposition: A | Discharge status: Home / Self Care | Payer: Medicare Other | Source: Ambulatory Visit | Attending: Radiation Oncology | Admitting: Radiation Oncology

## 2014-03-30 DIAGNOSIS — Z510 Encounter for antineoplastic radiation therapy: Secondary | ICD-10-CM | POA: Diagnosis not present

## 2014-03-31 ENCOUNTER — Ambulatory Visit
Admission: RE | Admit: 2014-03-31 | Discharge: 2014-03-31 | Disposition: A | Discharge status: Home / Self Care | Payer: Medicare Other | Source: Ambulatory Visit | Attending: Radiation Oncology | Admitting: Radiation Oncology

## 2014-03-31 DIAGNOSIS — Z510 Encounter for antineoplastic radiation therapy: Secondary | ICD-10-CM | POA: Diagnosis not present

## 2014-04-03 ENCOUNTER — Ambulatory Visit
Admission: RE | Admit: 2014-04-03 | Discharge: 2014-04-03 | Disposition: A | Discharge status: Home / Self Care | Payer: Medicare Other | Source: Ambulatory Visit | Attending: Radiation Oncology | Admitting: Radiation Oncology

## 2014-04-03 ENCOUNTER — Ambulatory Visit
Admission: RE | Admit: 2014-04-03 | Discharge: 2014-04-03 | Disposition: A | Discharge status: Home / Self Care | Payer: Non-veteran care | Source: Ambulatory Visit | Attending: Radiation Oncology | Admitting: Radiation Oncology

## 2014-04-03 ENCOUNTER — Encounter: Payer: Self-pay | Admitting: Radiation Oncology

## 2014-04-03 VITALS — BP 144/77 | HR 59 | Temp 97.8°F | Resp 12 | Wt 265.1 lb

## 2014-04-03 DIAGNOSIS — C61 Malignant neoplasm of prostate: Secondary | ICD-10-CM

## 2014-04-03 DIAGNOSIS — Z510 Encounter for antineoplastic radiation therapy: Secondary | ICD-10-CM | POA: Diagnosis not present

## 2014-04-03 NOTE — Progress Notes (Signed)
He is currently in no pain. Pt complains of fatigue and loss of sleep Pt reports urinary frequency, urgency, hesistency, pain with urination and hot flashes. Pt states they urinate 4 - 5 times per night.  Pt reports a soft bowel movement everyday/everyother day. BP 144/77 mmHg  Pulse 59  Temp(Src) 97.8 F (36.6 C) (Oral)  Resp 12  Wt 265 lb 1.6 oz (120.249 kg)  SpO2 99%

## 2014-04-03 NOTE — Progress Notes (Signed)
Weekly Management Note:  Site: Prostate Current Dose:  3120  cGy Projected Dose: 7800  cGy  Narrative: The patient is seen today for routine under treatment assessment. CBCT/MVCT images/port films were reviewed. The chart was reviewed.   Bladder filling is excellent.  No new GU or GI difficulties.  He still having nocturia 4-5.  He does feel that tamsulosin is helpful and he believes that he is able to occasionally empty his bladder.  He is having less hesitancy.  Physical Examination:  Filed Vitals:   04/03/14 1105  BP: 144/77  Pulse: 59  Temp: 97.8 F (36.6 C)  Resp: 12  .  Weight: 265 lb 1.6 oz (120.249 kg).  No change.  Impression: Tolerating radiation therapy well.  Plan: Continue radiation therapy as planned.

## 2014-04-04 ENCOUNTER — Ambulatory Visit
Admission: RE | Admit: 2014-04-04 | Discharge: 2014-04-04 | Disposition: A | Discharge status: Home / Self Care | Payer: Medicare Other | Source: Ambulatory Visit | Attending: Radiation Oncology | Admitting: Radiation Oncology

## 2014-04-04 DIAGNOSIS — Z510 Encounter for antineoplastic radiation therapy: Secondary | ICD-10-CM | POA: Diagnosis not present

## 2014-04-05 ENCOUNTER — Ambulatory Visit
Admission: RE | Admit: 2014-04-05 | Discharge: 2014-04-05 | Disposition: A | Discharge status: Home / Self Care | Payer: Medicare Other | Source: Ambulatory Visit | Attending: Radiation Oncology | Admitting: Radiation Oncology

## 2014-04-05 DIAGNOSIS — Z510 Encounter for antineoplastic radiation therapy: Secondary | ICD-10-CM | POA: Diagnosis not present

## 2014-04-06 ENCOUNTER — Ambulatory Visit
Admission: RE | Admit: 2014-04-06 | Discharge: 2014-04-06 | Disposition: A | Discharge status: Home / Self Care | Payer: Medicare Other | Source: Ambulatory Visit | Attending: Radiation Oncology | Admitting: Radiation Oncology

## 2014-04-06 DIAGNOSIS — Z510 Encounter for antineoplastic radiation therapy: Secondary | ICD-10-CM | POA: Diagnosis not present

## 2014-04-07 ENCOUNTER — Ambulatory Visit
Admission: RE | Admit: 2014-04-07 | Discharge: 2014-04-07 | Disposition: A | Discharge status: Home / Self Care | Payer: Medicare Other | Source: Ambulatory Visit | Attending: Radiation Oncology | Admitting: Radiation Oncology

## 2014-04-07 DIAGNOSIS — Z510 Encounter for antineoplastic radiation therapy: Secondary | ICD-10-CM | POA: Diagnosis not present

## 2014-04-10 ENCOUNTER — Encounter: Payer: Self-pay | Admitting: Radiation Oncology

## 2014-04-10 ENCOUNTER — Ambulatory Visit
Admission: RE | Admit: 2014-04-10 | Discharge: 2014-04-10 | Disposition: A | Discharge status: Home / Self Care | Payer: Non-veteran care | Source: Ambulatory Visit | Attending: Radiation Oncology | Admitting: Radiation Oncology

## 2014-04-10 ENCOUNTER — Ambulatory Visit
Admission: RE | Admit: 2014-04-10 | Discharge: 2014-04-10 | Disposition: A | Discharge status: Home / Self Care | Payer: Medicare Other | Source: Ambulatory Visit | Attending: Radiation Oncology | Admitting: Radiation Oncology

## 2014-04-10 VITALS — BP 149/77 | HR 64 | Temp 98.2°F | Resp 12 | Wt 268.9 lb

## 2014-04-10 DIAGNOSIS — C61 Malignant neoplasm of prostate: Secondary | ICD-10-CM

## 2014-04-10 DIAGNOSIS — Z510 Encounter for antineoplastic radiation therapy: Secondary | ICD-10-CM | POA: Diagnosis not present

## 2014-04-10 NOTE — Progress Notes (Signed)
Weekly Management Note:  Site: Prostate Current Dose:  4095  cGy Projected Dose: 7800  cGy  Narrative: The patient is seen today for routine under treatment assessment. CBCT/MVCT images/port films were reviewed. The chart was reviewed.   Bladder filling is satisfactory.  No GU or GI difficulty.  Physical Examination:  Filed Vitals:   04/10/14 1022  BP: 149/77  Pulse: 64  Temp: 98.2 F (36.8 C)  Resp: 12  .  Weight: 268 lb 14.4 oz (121.972 kg).  No change.  Impression: Tolerating radiation therapy well.  Plan: Continue radiation therapy as planned.

## 2014-04-10 NOTE — Progress Notes (Signed)
He is currently in no pain.  Pt complains of fatigue and loss of sleep Pt reports urinary hesistency and hot flashes. Pt states they urinate 2 - 3 times per night.  Pt reports a soft bowel movement everyday/everyother day. BP 149/77 mmHg  Pulse 64  Temp(Src) 98.2 F (36.8 C) (Oral)  Resp 12  Wt 268 lb 14.4 oz (121.972 kg)  SpO2 100%

## 2014-04-11 ENCOUNTER — Ambulatory Visit
Admission: RE | Admit: 2014-04-11 | Discharge: 2014-04-11 | Disposition: A | Discharge status: Home / Self Care | Payer: Medicare Other | Source: Ambulatory Visit | Attending: Radiation Oncology | Admitting: Radiation Oncology

## 2014-04-11 DIAGNOSIS — Z510 Encounter for antineoplastic radiation therapy: Secondary | ICD-10-CM | POA: Diagnosis not present

## 2014-04-12 ENCOUNTER — Ambulatory Visit
Admission: RE | Admit: 2014-04-12 | Discharge: 2014-04-12 | Disposition: A | Discharge status: Home / Self Care | Payer: Medicare Other | Source: Ambulatory Visit | Attending: Radiation Oncology | Admitting: Radiation Oncology

## 2014-04-12 DIAGNOSIS — Z510 Encounter for antineoplastic radiation therapy: Secondary | ICD-10-CM | POA: Diagnosis not present

## 2014-04-13 ENCOUNTER — Ambulatory Visit
Admission: RE | Admit: 2014-04-13 | Discharge: 2014-04-13 | Disposition: A | Discharge status: Home / Self Care | Payer: Medicare Other | Source: Ambulatory Visit | Attending: Radiation Oncology | Admitting: Radiation Oncology

## 2014-04-13 DIAGNOSIS — Z510 Encounter for antineoplastic radiation therapy: Secondary | ICD-10-CM | POA: Diagnosis not present

## 2014-04-14 ENCOUNTER — Ambulatory Visit: Payer: Medicare Other

## 2014-04-17 ENCOUNTER — Encounter: Payer: Self-pay | Admitting: Radiation Oncology

## 2014-04-17 ENCOUNTER — Ambulatory Visit
Admission: RE | Admit: 2014-04-17 | Discharge: 2014-04-17 | Disposition: A | Discharge status: Home / Self Care | Payer: Medicare Other | Source: Ambulatory Visit | Attending: Radiation Oncology | Admitting: Radiation Oncology

## 2014-04-17 ENCOUNTER — Ambulatory Visit: Admission: RE | Admit: 2014-04-17 | Payer: Medicare Other | Source: Ambulatory Visit

## 2014-04-17 ENCOUNTER — Ambulatory Visit
Admission: RE | Admit: 2014-04-17 | Discharge: 2014-04-17 | Disposition: A | Discharge status: Home / Self Care | Payer: Non-veteran care | Source: Ambulatory Visit | Attending: Radiation Oncology | Admitting: Radiation Oncology

## 2014-04-17 VITALS — BP 129/88 | HR 59 | Temp 98.2°F | Resp 12 | Wt 269.9 lb

## 2014-04-17 DIAGNOSIS — C61 Malignant neoplasm of prostate: Secondary | ICD-10-CM

## 2014-04-17 NOTE — Progress Notes (Signed)
Weekly Management Note:  Site: Prostate Current Dose:  4875  cGy Projected Dose: 7800  cGy  Narrative: The patient is seen today for routine under treatment assessment. CBCT/MVCT images/port films were reviewed. The chart was reviewed.   Bladder filling satisfactory.  No new GU or GI difficulties.  Tamsulosin is helpful.  Physical Examination:  Filed Vitals:   04/17/14 1015  BP: 129/88  Pulse: 59  Temp: 98.2 F (36.8 C)  Resp: 12  .  Weight: 269 lb 14.4 oz (122.426 kg).  No change.  Impression: Tolerating radiation therapy well.  Plan: Continue radiation therapy as planned.

## 2014-04-17 NOTE — Progress Notes (Signed)
He rates his pain as a 2 on a scale of 0-10. intermittent and aching over back. Pt complains of fatigue and loss of sleep Pt reports urinary frequency, hesistency and hot flashes. Pt states they urinate 4 - 5 times per night.  Pt reports a soft bowel movement everyday/everyother day. BP 129/88 mmHg  Pulse 59  Temp(Src) 98.2 F (36.8 C) (Oral)  Resp 12  Wt 269 lb 14.4 oz (122.426 kg)  SpO2 99%

## 2014-04-18 ENCOUNTER — Ambulatory Visit
Admission: RE | Admit: 2014-04-18 | Discharge: 2014-04-18 | Disposition: A | Discharge status: Home / Self Care | Payer: Medicare Other | Source: Ambulatory Visit | Attending: Radiation Oncology | Admitting: Radiation Oncology

## 2014-04-18 DIAGNOSIS — Z510 Encounter for antineoplastic radiation therapy: Secondary | ICD-10-CM | POA: Diagnosis not present

## 2014-04-19 ENCOUNTER — Ambulatory Visit
Admission: RE | Admit: 2014-04-19 | Discharge: 2014-04-19 | Disposition: A | Discharge status: Home / Self Care | Payer: Medicare Other | Source: Ambulatory Visit | Attending: Radiation Oncology | Admitting: Radiation Oncology

## 2014-04-19 DIAGNOSIS — Z510 Encounter for antineoplastic radiation therapy: Secondary | ICD-10-CM | POA: Diagnosis not present

## 2014-04-20 ENCOUNTER — Ambulatory Visit
Admission: RE | Admit: 2014-04-20 | Discharge: 2014-04-20 | Disposition: A | Discharge status: Home / Self Care | Payer: Medicare Other | Source: Ambulatory Visit | Attending: Radiation Oncology | Admitting: Radiation Oncology

## 2014-04-20 DIAGNOSIS — Z510 Encounter for antineoplastic radiation therapy: Secondary | ICD-10-CM | POA: Diagnosis not present

## 2014-04-21 ENCOUNTER — Ambulatory Visit
Admission: RE | Admit: 2014-04-21 | Discharge: 2014-04-21 | Disposition: A | Discharge status: Home / Self Care | Payer: Medicare Other | Source: Ambulatory Visit | Attending: Radiation Oncology | Admitting: Radiation Oncology

## 2014-04-21 DIAGNOSIS — Z510 Encounter for antineoplastic radiation therapy: Secondary | ICD-10-CM | POA: Diagnosis not present

## 2014-04-24 ENCOUNTER — Encounter: Payer: Self-pay | Admitting: Radiation Oncology

## 2014-04-24 ENCOUNTER — Ambulatory Visit
Admission: RE | Admit: 2014-04-24 | Discharge: 2014-04-24 | Disposition: A | Discharge status: Home / Self Care | Payer: Medicare Other | Source: Ambulatory Visit | Attending: Radiation Oncology | Admitting: Radiation Oncology

## 2014-04-24 DIAGNOSIS — Z510 Encounter for antineoplastic radiation therapy: Secondary | ICD-10-CM | POA: Diagnosis not present

## 2014-04-25 ENCOUNTER — Encounter: Payer: Self-pay | Admitting: Radiation Oncology

## 2014-04-25 ENCOUNTER — Ambulatory Visit
Admission: RE | Admit: 2014-04-25 | Discharge: 2014-04-25 | Disposition: A | Discharge status: Home / Self Care | Payer: Non-veteran care | Source: Ambulatory Visit | Attending: Radiation Oncology | Admitting: Radiation Oncology

## 2014-04-25 ENCOUNTER — Ambulatory Visit
Admission: RE | Admit: 2014-04-25 | Discharge: 2014-04-25 | Disposition: A | Discharge status: Home / Self Care | Payer: Medicare Other | Source: Ambulatory Visit | Attending: Radiation Oncology | Admitting: Radiation Oncology

## 2014-04-25 VITALS — BP 134/71 | HR 60 | Temp 98.0°F | Ht 72.0 in | Wt 268.2 lb

## 2014-04-25 DIAGNOSIS — Z510 Encounter for antineoplastic radiation therapy: Secondary | ICD-10-CM | POA: Diagnosis not present

## 2014-04-25 DIAGNOSIS — C61 Malignant neoplasm of prostate: Secondary | ICD-10-CM

## 2014-04-25 NOTE — Progress Notes (Signed)
Mr. Larry Rasmussen has nocturia of 3-4 times.  He denies any burning upon urination, proctitis, nor change in bowel habits.

## 2014-04-25 NOTE — Progress Notes (Signed)
Weekly Management Note:  Site: Prostate Current Dose:  5850  cGy Projected Dose: 7800  cGy  Narrative: The patient is seen today for routine under treatment assessment. CBCT/MVCT images/port films were reviewed. The chart was reviewed.   Bladder filling is excellent.  No new GU or GI difficulties.  Physical Examination:  Filed Vitals:   04/25/14 1024  BP: 134/71  Pulse: 60  Temp: 98 F (36.7 C)  .  Weight: 268 lb 3.2 oz (121.655 kg).  No change.  Impression: Tolerating radiation therapy well.  Plan: Continue radiation therapy as planned.

## 2014-04-26 ENCOUNTER — Ambulatory Visit
Admission: RE | Admit: 2014-04-26 | Discharge: 2014-04-26 | Disposition: A | Discharge status: Home / Self Care | Payer: Medicare Other | Source: Ambulatory Visit | Attending: Radiation Oncology | Admitting: Radiation Oncology

## 2014-04-26 DIAGNOSIS — Z510 Encounter for antineoplastic radiation therapy: Secondary | ICD-10-CM | POA: Diagnosis not present

## 2014-04-27 ENCOUNTER — Ambulatory Visit
Admission: RE | Admit: 2014-04-27 | Discharge: 2014-04-27 | Disposition: A | Discharge status: Home / Self Care | Payer: Medicare Other | Source: Ambulatory Visit | Attending: Radiation Oncology | Admitting: Radiation Oncology

## 2014-04-27 DIAGNOSIS — Z510 Encounter for antineoplastic radiation therapy: Secondary | ICD-10-CM | POA: Diagnosis not present

## 2014-04-28 ENCOUNTER — Ambulatory Visit
Admission: RE | Admit: 2014-04-28 | Discharge: 2014-04-28 | Disposition: A | Discharge status: Home / Self Care | Payer: Medicare Other | Source: Ambulatory Visit | Attending: Radiation Oncology | Admitting: Radiation Oncology

## 2014-04-28 DIAGNOSIS — Z510 Encounter for antineoplastic radiation therapy: Secondary | ICD-10-CM | POA: Diagnosis not present

## 2014-05-01 ENCOUNTER — Ambulatory Visit
Admission: RE | Admit: 2014-05-01 | Discharge: 2014-05-01 | Disposition: A | Discharge status: Home / Self Care | Payer: Non-veteran care | Source: Ambulatory Visit | Attending: Radiation Oncology | Admitting: Radiation Oncology

## 2014-05-01 ENCOUNTER — Encounter: Payer: Self-pay | Admitting: Radiation Oncology

## 2014-05-01 ENCOUNTER — Ambulatory Visit
Admission: RE | Admit: 2014-05-01 | Discharge: 2014-05-01 | Disposition: A | Discharge status: Home / Self Care | Payer: Medicare Other | Source: Ambulatory Visit | Attending: Radiation Oncology | Admitting: Radiation Oncology

## 2014-05-01 VITALS — BP 150/75 | HR 107 | Temp 98.0°F | Resp 20 | Wt 269.2 lb

## 2014-05-01 DIAGNOSIS — C61 Malignant neoplasm of prostate: Secondary | ICD-10-CM

## 2014-05-01 DIAGNOSIS — Z510 Encounter for antineoplastic radiation therapy: Secondary | ICD-10-CM | POA: Diagnosis not present

## 2014-05-01 NOTE — Progress Notes (Signed)
Weekly Management Note:  Site: Prostate Current Dose:  6630  cGy Projected Dose: 7800  cGy  Narrative: The patient is seen today for routine under treatment assessment. CBCT/MVCT images/port films were reviewed. The chart was reviewed.   Bladder filling is excellent.  No new GU or GI difficulties.  He continues with Flomax.  Physical Examination:  Filed Vitals:   05/01/14 1047  BP: 150/75  Pulse: 107  Temp: 98 F (36.7 C)  Resp: 20  .  Weight: 269 lb 3.2 oz (122.108 kg).  No change.  Impression: Tolerating radiation therapy well.  Plan: Continue radiation therapy as planned.

## 2014-05-01 NOTE — Progress Notes (Signed)
Weekly rad txs  Prostate 34/40  completed ,no changes from last week, had to stop half way through treatment today to go void, nocturia 3-4x, regular bowel movments stated , appetite good,  On flomax , 10:50 AM  .

## 2014-05-02 ENCOUNTER — Ambulatory Visit
Admission: RE | Admit: 2014-05-02 | Discharge: 2014-05-02 | Disposition: A | Discharge status: Home / Self Care | Payer: Medicare Other | Source: Ambulatory Visit | Attending: Radiation Oncology | Admitting: Radiation Oncology

## 2014-05-02 DIAGNOSIS — Z510 Encounter for antineoplastic radiation therapy: Secondary | ICD-10-CM | POA: Diagnosis not present

## 2014-05-03 ENCOUNTER — Ambulatory Visit
Admission: RE | Admit: 2014-05-03 | Discharge: 2014-05-03 | Disposition: A | Discharge status: Home / Self Care | Payer: Medicare Other | Source: Ambulatory Visit | Attending: Radiation Oncology | Admitting: Radiation Oncology

## 2014-05-03 DIAGNOSIS — Z510 Encounter for antineoplastic radiation therapy: Secondary | ICD-10-CM | POA: Diagnosis not present

## 2014-05-04 ENCOUNTER — Ambulatory Visit
Admission: RE | Admit: 2014-05-04 | Discharge: 2014-05-04 | Disposition: A | Discharge status: Home / Self Care | Payer: Medicare Other | Source: Ambulatory Visit | Attending: Radiation Oncology | Admitting: Radiation Oncology

## 2014-05-04 DIAGNOSIS — Z510 Encounter for antineoplastic radiation therapy: Secondary | ICD-10-CM | POA: Diagnosis not present

## 2014-05-05 ENCOUNTER — Ambulatory Visit
Admission: RE | Admit: 2014-05-05 | Discharge: 2014-05-05 | Disposition: A | Discharge status: Home / Self Care | Payer: Medicare Other | Source: Ambulatory Visit | Attending: Radiation Oncology | Admitting: Radiation Oncology

## 2014-05-05 ENCOUNTER — Ambulatory Visit: Payer: Medicare Other

## 2014-05-05 DIAGNOSIS — Z510 Encounter for antineoplastic radiation therapy: Secondary | ICD-10-CM | POA: Diagnosis not present

## 2014-05-08 ENCOUNTER — Ambulatory Visit
Admission: RE | Admit: 2014-05-08 | Discharge: 2014-05-08 | Disposition: A | Discharge status: Home / Self Care | Payer: Medicare Other | Source: Ambulatory Visit | Attending: Radiation Oncology | Admitting: Radiation Oncology

## 2014-05-08 ENCOUNTER — Encounter: Payer: Self-pay | Admitting: Radiation Oncology

## 2014-05-08 ENCOUNTER — Ambulatory Visit: Payer: Medicare Other

## 2014-05-08 VITALS — BP 132/74 | HR 59 | Temp 98.0°F | Ht 72.0 in | Wt 266.7 lb

## 2014-05-08 DIAGNOSIS — Z510 Encounter for antineoplastic radiation therapy: Secondary | ICD-10-CM | POA: Diagnosis not present

## 2014-05-08 DIAGNOSIS — C61 Malignant neoplasm of prostate: Secondary | ICD-10-CM

## 2014-05-08 NOTE — Progress Notes (Signed)
Weekly Management Note:   ICD-9-CM ICD-10-CM   1. Malignant neoplasm of prostate 185 C61     Site: Prostate Current Dose: 7605 cGy Projected Dose: 7800  cGy  Narrative: The patient is seen today for routine under treatment assessment. CBCT/MVCT images/port films were reviewed. The chart was reviewed. Larry Rasmussen has received 39 fractions to his prostate.  He reports straining to void and urinates ~ every 1 1/2 hours during the night and also reports fatigue.  Denies any dysuria.  All symptoms are stable.  Physical Examination:  Filed Vitals:   05/08/14 1116  BP: 132/74  Pulse: 59  Temp: 98 F (36.7 C)  .  Weight: 266 lb 11.2 oz (120.974 kg).  NAD  Impression: Tolerating radiation therapy well.  Plan: Continue radiation therapy as planned. f/u in 85month  -----------------------------------  Eppie Gibson, MD

## 2014-05-08 NOTE — Progress Notes (Signed)
Larry Rasmussen has received 39 fractions to his prostate.  He reports straining to void and urinates ~ every 1 1/2 hours during the night and also reports fatigue.  Denies any dysuria.

## 2014-05-09 ENCOUNTER — Ambulatory Visit
Admission: RE | Admit: 2014-05-09 | Discharge: 2014-05-09 | Disposition: A | Discharge status: Home / Self Care | Payer: Medicare Other | Source: Ambulatory Visit | Attending: Radiation Oncology | Admitting: Radiation Oncology

## 2014-05-09 ENCOUNTER — Ambulatory Visit: Payer: Medicare Other

## 2014-05-09 DIAGNOSIS — Z510 Encounter for antineoplastic radiation therapy: Secondary | ICD-10-CM | POA: Diagnosis not present

## 2014-05-13 ENCOUNTER — Encounter: Payer: Self-pay | Admitting: Radiation Oncology

## 2014-05-13 NOTE — Progress Notes (Signed)
[facility] End of Treatment Note  Name:Larry Rasmussen  Date: 05/13/2014 PNT:[id number] DOB:Dec 04, 1945   Status:outpatient    CC: Dr. Rana Snare  REFERRING PHYSICIAN:  Dr. Rana Snare   DIAGNOSIS:  Stage TIc high risk adenocarcinoma prostate  INDICATION FOR TREATMENT: Curative   TREATMENT DATES: 03/13/2014 through 05/09/2014                          SITE/DOSE:    Prostate 7800 cGy in 40 sessions, seminal vesicles 5600 cGy in 40 sessions                        BEAMS/ENERGY:  Dual ARC VMAT IMRT with 6 MV photons                 NARRATIVE:    The patient tolerated his treatment reasonably well although he did have some slowing of his urinary stream with increased urinary frequency which responded to tamsulosin.  He continues with his androgen deprivation therapy for his high-risk disease.                        PLAN: Routine followup in one month. Patient instructed to call if questions or worsening complaints in interim.

## 2014-06-02 ENCOUNTER — Encounter: Payer: Self-pay | Admitting: Radiation Oncology

## 2014-06-06 ENCOUNTER — Ambulatory Visit
Admission: RE | Admit: 2014-06-06 | Discharge: 2014-06-06 | Disposition: A | Discharge status: Home / Self Care | Payer: Medicare Other | Source: Ambulatory Visit | Attending: Radiation Oncology | Admitting: Radiation Oncology

## 2014-06-06 ENCOUNTER — Encounter: Payer: Self-pay | Admitting: Radiation Oncology

## 2014-06-06 VITALS — BP 141/62 | HR 64 | Temp 98.2°F | Ht 72.0 in | Wt 269.0 lb

## 2014-06-06 DIAGNOSIS — C61 Malignant neoplasm of prostate: Secondary | ICD-10-CM

## 2014-06-06 HISTORY — DX: Personal history of irradiation: Z92.3

## 2014-06-06 MED ORDER — TAMSULOSIN HCL 0.4 MG PO CAPS: 0.4000 mg | ORAL_CAPSULE | Freq: Every day | ORAL | Status: DC

## 2014-06-06 NOTE — Progress Notes (Signed)
CC: Dr. Rana Snare  Follow-up note:  Larry Rasmussen visits today approximately 6 weeks following completion of radiation therapy/IMRT in the management of his stage TIc high-risk adenocarcinoma prostate.  He continues with his androgen deprivation therapy.  He try to stop his tamsulosin but he developed worsening obstructive symptoms and he re-started tamsulosin.  He will see Dr. Risa Grill for a follow-up visit in June.  He has nocturia 3.  No GI difficulties.  His examination: Alert and oriented. Filed Vitals:   06/06/14 1121  BP: 141/62  Pulse: 64  Temp: 98.2 F (36.8 C)   Rectal examination not performed today.  Impression: Satisfactory progress.  I will renew his tamsulosin today.  He'll continue with androgen deprivation therapy for a full 2 years through Dr. Risa Grill.  Plan: Follow-up visit with Dr. Risa Grill in June.  I've not scheduled the patient for a formal follow-up visit and I ask that Dr. Risa Grill keep me posted on his progress.

## 2014-06-06 NOTE — Progress Notes (Signed)
Larry Rasmussen here for reassessment s/p radiation to his plevis for prostate cancer.  He reports that he stopped Flomax with reuslting difficulty voiding and restarted.   Stream stops and starts once during voiding.  Nocturia x 1.   Denies any proctitis.   Hot flashes decrease his ability to sleep.

## 2014-06-07 ENCOUNTER — Ambulatory Visit: Payer: Non-veteran care | Admitting: Radiation Oncology

## 2014-08-24 ENCOUNTER — Encounter: Payer: Self-pay | Admitting: Gastroenterology

## 2016-03-22 IMAGING — MR MR PROSTATE WO/W CM
49 of 53 series · 49 of 53 positions shown · IV contrast (multihance)
Comparison: None.

CLINICAL DATA: Elevated PSA.  Negative biopsy 01/25/2010.

EXAM:
MR PROSTATE WITHOUT AND WITH CONTRAST
TECHNIQUE: Multiplanar multisequence MRI images were obtained of the pelvis
centered about the prostate. Pre and post contrast images were
obtained.
CONTRAST:  20mL MULTIHANCE GADOBENATE DIMEGLUMINE 529 MG/ML IV SOLN

[Series 2: T1 · axial · 8.0mm · 0.78mm/px · 1 of 28 slices shown (1 of 2)]
[im 1/28]
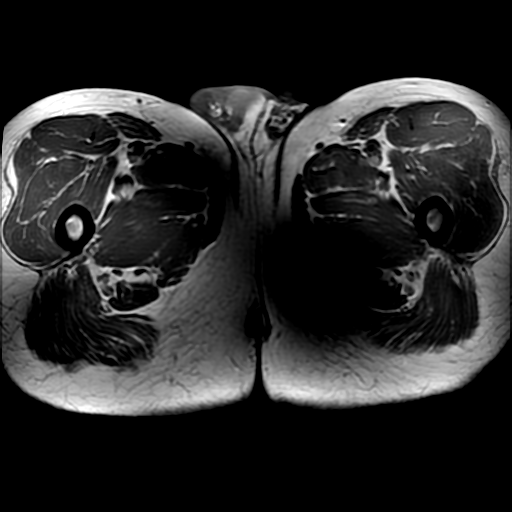

[Series 3: T2 · sagittal · 3.0mm · 0.31mm/px · 1 of 26 slices shown (1 of 3)]
[im 1/26]
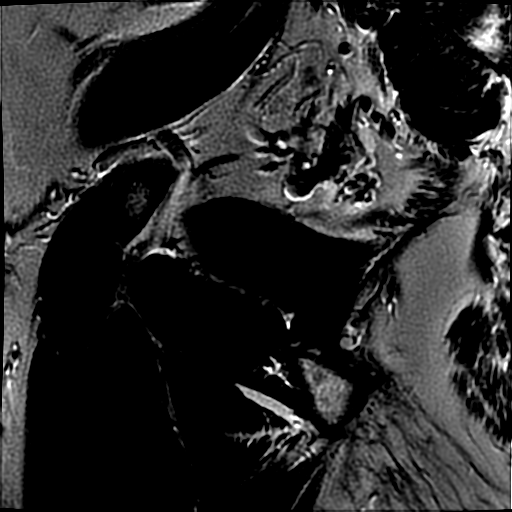

[Series 4: T1 · axial · 3.0mm · 0.31mm/px · 1 of 24 slices shown (2 of 2)]
[im 1/24]
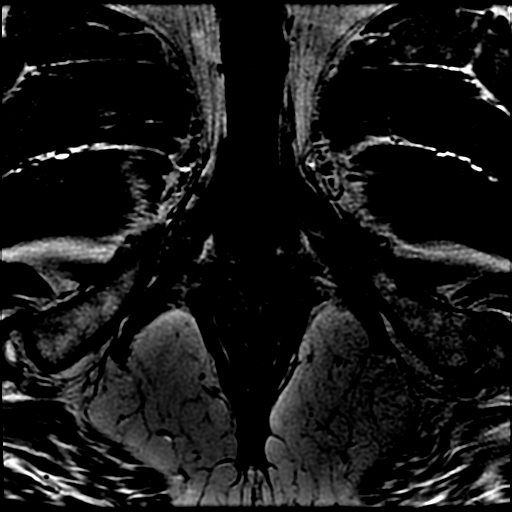

[Series 5: T2 · axial · 3.0mm · 0.31mm/px · 1 of 24 slices shown (2 of 3)]
[im 1/24]
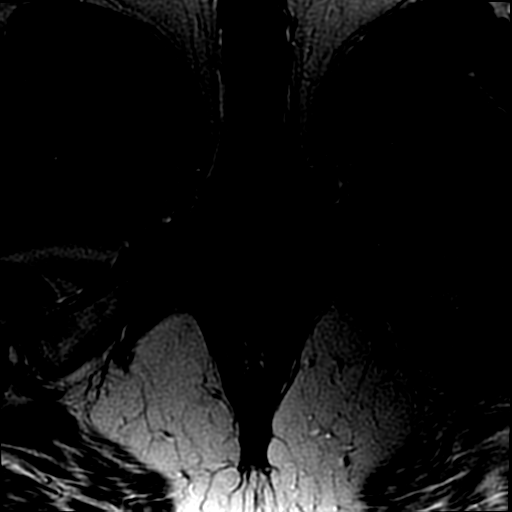

[Series 6: DWI · axial · 3.0mm · 0.70mm/px · 1 of 64 slices shown (1 of 2)]
[im 1/64]
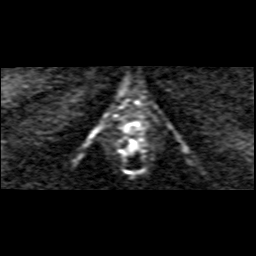

[Series 7: T2 · coronal · 3.0mm · 0.31mm/px · 1 of 22 slices shown (3 of 3)]
[im 1/22]
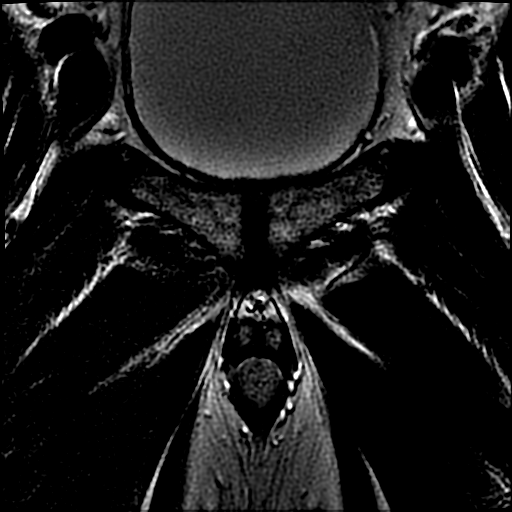

[Series 8: bSSFP fat-sat · axial · 8.0mm · 0.78mm/px · 1 of 28 slices shown]
[im 1/28]
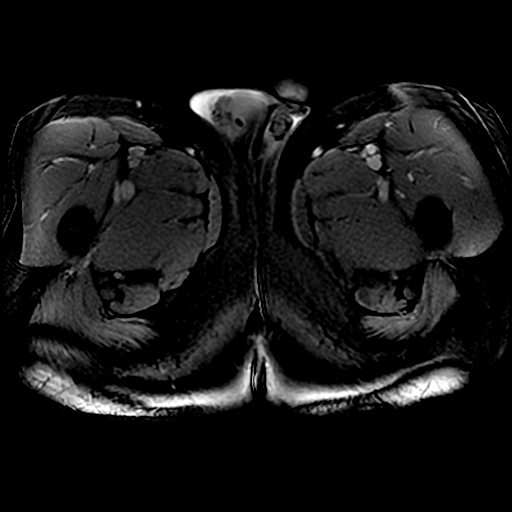

[Series 600: DWI · axial · 3.0mm · 0.70mm/px · 1 of 4 slices shown (2 of 2)]
[im 1/4]
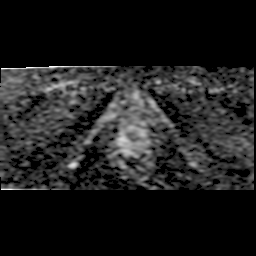

[Series 1000: T1 dynamic · axial · 4.0mm · 1.02mm/px · 1 of 26 slices shown (1 of 24)]
[im 1/26]
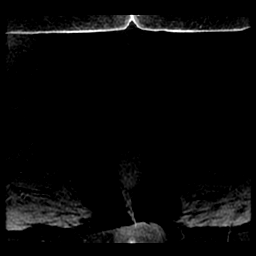

[Series 1001: T1 dynamic · axial · 4.0mm · 1.02mm/px · 1 of 26 slices shown (2 of 24)]
[im 1/26]
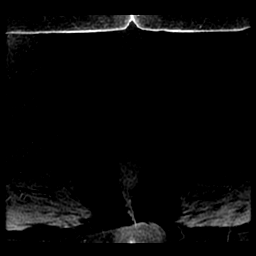

[Series 1002: T1 dynamic · axial · 4.0mm · 1.02mm/px · 1 of 26 slices shown (3 of 24)]
[im 1/26]
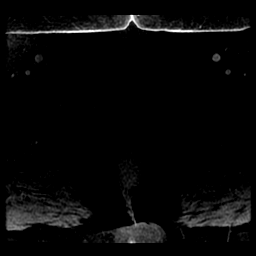

[Series 1003: T1 dynamic · axial · 4.0mm · 1.02mm/px · 1 of 26 slices shown (4 of 24)]
[im 1/26]
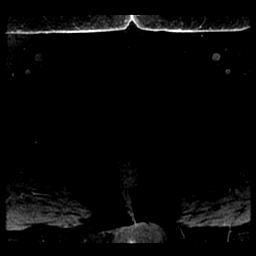

[Series 1004: T1 dynamic · axial · 4.0mm · 1.02mm/px · 1 of 26 slices shown (5 of 24)]
[im 1/26]
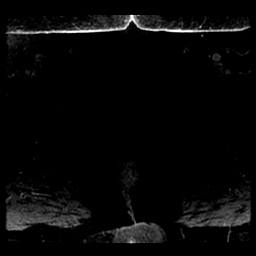

[Series 1005: T1 dynamic · axial · 4.0mm · 1.02mm/px · 1 of 26 slices shown (6 of 24)]
[im 1/26]
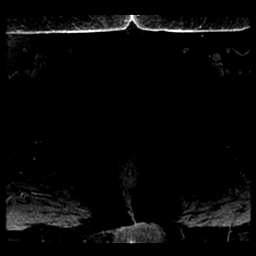

[Series 1006: T1 dynamic · axial · 4.0mm · 1.02mm/px · 1 of 26 slices shown (7 of 24)]
[im 1/26]
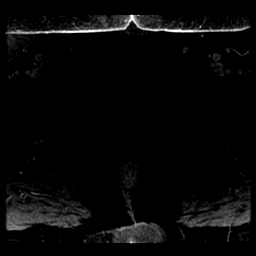

[Series 1007: T1 dynamic · axial · 4.0mm · 1.02mm/px · 1 of 26 slices shown (8 of 24)]
[im 1/26]
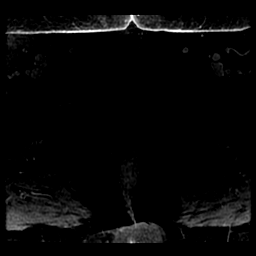

[Series 1008: T1 dynamic · axial · 4.0mm · 1.02mm/px · 1 of 26 slices shown (9 of 24)]
[im 1/26]
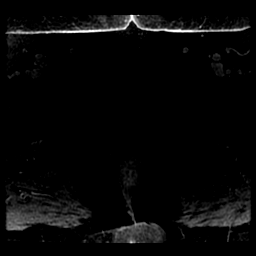

[Series 1009: T1 dynamic · axial · 4.0mm · 1.02mm/px · 1 of 26 slices shown (10 of 24)]
[im 1/26]
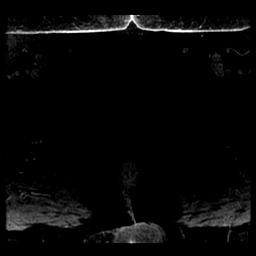

[Series 1010: T1 dynamic · axial · 4.0mm · 1.02mm/px · 1 of 26 slices shown (11 of 24)]
[im 1/26]
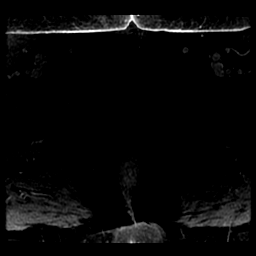

[Series 1011: T1 dynamic · axial · 4.0mm · 1.02mm/px · 1 of 26 slices shown (12 of 24)]
[im 1/26]
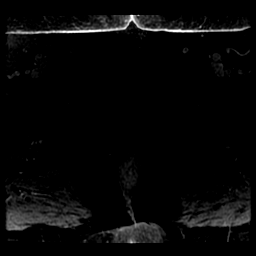

[Series 1012: T1 dynamic · axial · 4.0mm · 1.02mm/px · 1 of 26 slices shown (13 of 24)]
[im 1/26]
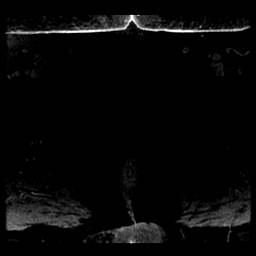

[Series 1013: T1 dynamic · axial · 4.0mm · 1.02mm/px · 1 of 26 slices shown (14 of 24)]
[im 1/26]
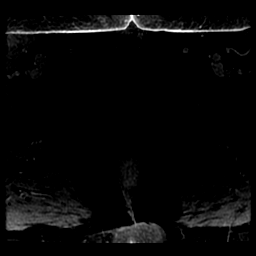

[Series 1014: T1 dynamic · axial · 4.0mm · 1.02mm/px · 1 of 26 slices shown (15 of 24)]
[im 1/26]
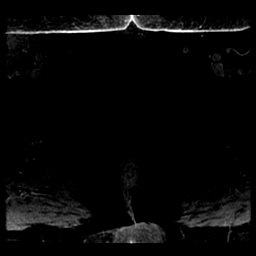

[Series 1015: T1 dynamic · axial · 4.0mm · 1.02mm/px · 1 of 26 slices shown (16 of 24)]
[im 1/26]
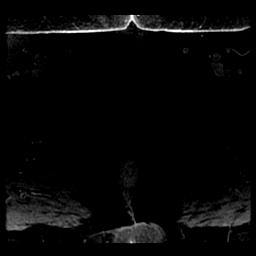

[Series 1016: T1 dynamic · axial · 4.0mm · 1.02mm/px · 1 of 26 slices shown (17 of 24)]
[im 1/26]
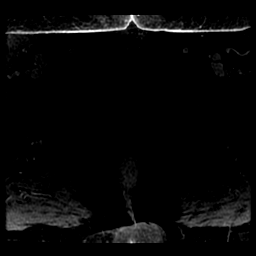

[Series 1017: T1 dynamic · axial · 4.0mm · 1.02mm/px · 1 of 26 slices shown (18 of 24)]
[im 1/26]
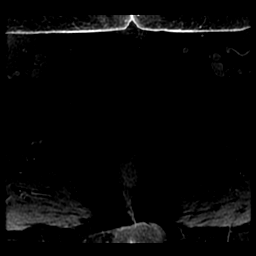

[Series 1018: T1 dynamic · axial · 4.0mm · 1.02mm/px · 1 of 26 slices shown (19 of 24)]
[im 1/26]
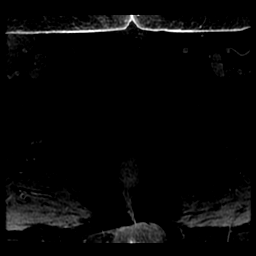

[Series 1019: T1 dynamic · axial · 4.0mm · 1.02mm/px · 1 of 26 slices shown (20 of 24)]
[im 1/26]
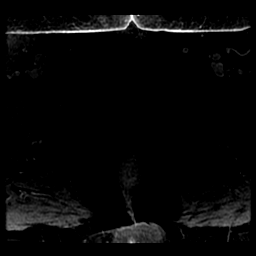

[Series 1020: T1 dynamic · axial · 4.0mm · 1.02mm/px · 1 of 26 slices shown (21 of 24)]
[im 1/26]
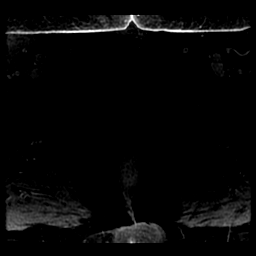

[Series 1021: T1 dynamic · axial · 4.0mm · 1.02mm/px · 1 of 26 slices shown (22 of 24)]
[im 1/26]
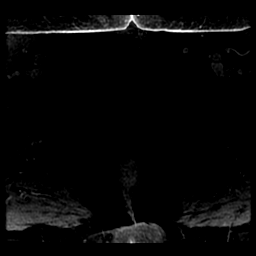

[Series 1022: T1 dynamic · axial · 4.0mm · 1.02mm/px · 1 of 26 slices shown (23 of 24)]
[im 1/26]
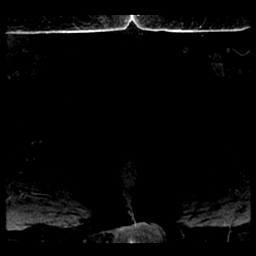

[Series 1023: T1 dynamic · axial · 4.0mm · 1.02mm/px · 1 of 26 slices shown (24 of 24)]
[im 1/26]
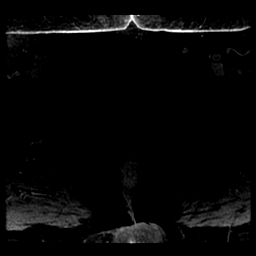

[((date))-((date)) · axial · 4.0mm · 1.02mm/px · 1 of 8 slices shown (1 of 17)]
[im 1/8]
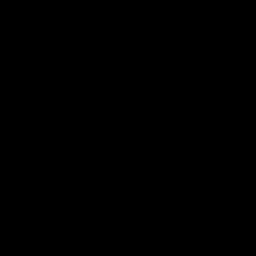

[((date))-((date)) · axial · 4.0mm · 1.02mm/px · 1 of 22 slices shown (2 of 17)]
[im 1/22]
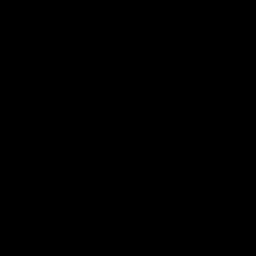

[((date))-((date)) · axial · 4.0mm · 1.02mm/px · 1 of 25 slices shown (3 of 17)]
[im 1/25]
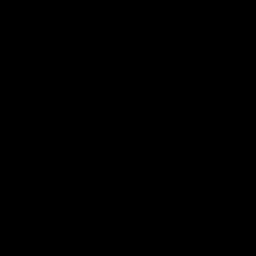

[((date))-((date)) · axial · 4.0mm · 1.02mm/px · 1 of 24 slices shown (4 of 17)]
[im 1/24]
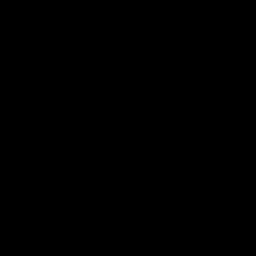

[((date))-((date)) · axial · 4.0mm · 1.02mm/px · 1 of 25 slices shown (5 of 17)]
[im 1/25]
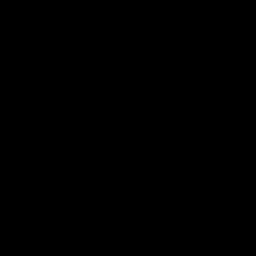

[((date))-((date)) · axial · 4.0mm · 1.02mm/px · 1 of 25 slices shown (6 of 17)]
[im 1/25]
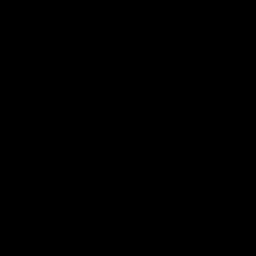

[((date))-((date)) · axial · 4.0mm · 1.02mm/px · 1 of 26 slices shown (7 of 17)]
[im 1/26]
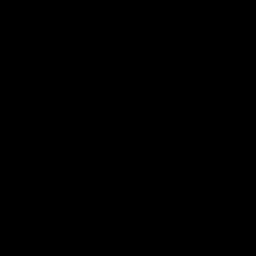

[((date))-((date)) · axial · 4.0mm · 1.02mm/px · 1 of 26 slices shown (8 of 17)]
[im 1/26]
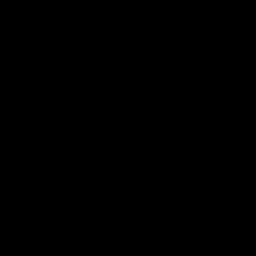

[((date))-((date)) · axial · 4.0mm · 1.02mm/px · 1 of 26 slices shown (9 of 17)]
[im 1/26]
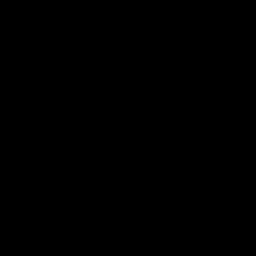

[((date))-((date)) · axial · 4.0mm · 1.02mm/px · 1 of 26 slices shown (10 of 17)]
[im 1/26]
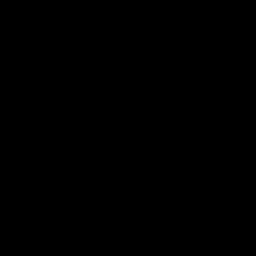

[((date))-((date)) · axial · 4.0mm · 1.02mm/px · 1 of 26 slices shown (11 of 17)]
[im 1/26]
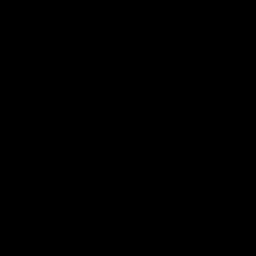

[((date))-((date)) · axial · 4.0mm · 1.02mm/px · 1 of 26 slices shown (12 of 17)]
[im 1/26]
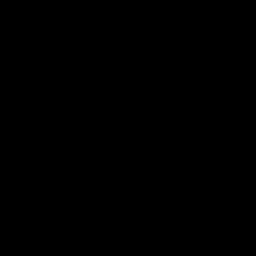

[((date))-((date)) · axial · 4.0mm · 1.02mm/px · 1 of 26 slices shown (13 of 17)]
[im 1/26]
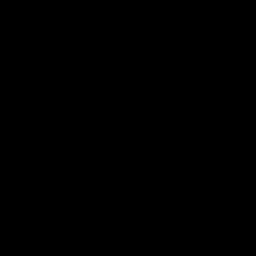

[((date))-((date)) · axial · 4.0mm · 1.02mm/px · 1 of 26 slices shown (14 of 17)]
[im 1/26]
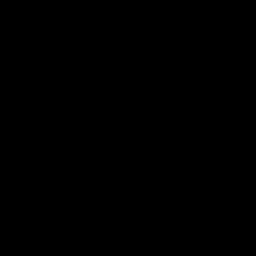

[((date))-((date)) · axial · 4.0mm · 1.02mm/px · 1 of 25 slices shown (15 of 17)]
[im 1/25]
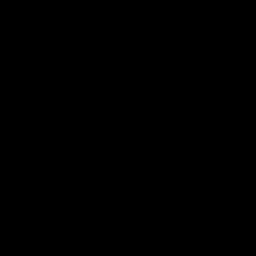

[((date))-((date)) · axial · 4.0mm · 1.02mm/px · 1 of 26 slices shown (16 of 17)]
[im 1/26]
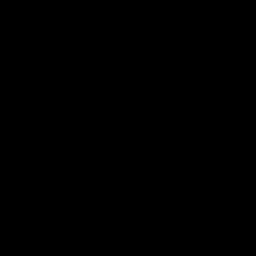

[((date))-((date)) · axial · 4.0mm · 1.02mm/px · 1 of 26 slices shown (17 of 17)]
[im 1/26]
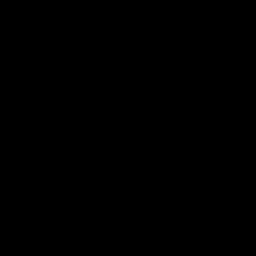

[49 of 53 positions shown; findings below may reference images not displayed]

FINDINGS: Prostate: Within the left base segment of the peripheral zone, there
is a 10 mm x 8 mm low intensity lesion on the T2 weighted imaging a
(image 11, series 5).. There is a second 9 mm lesion in the left
lateral mid gland (image 16, series 5). These two lesions
demonstrate restricted fusion on images 16 and 6 of series 600. No
clearly suspicious lesion the right peripheral zone.

There is no extra capsular extension.  Capsule appears intact.

The central gland is nodular.

The similar vesicles are normal.

The overall gland size measures 5.4 by 4.3 cm by 5.0 cm.

There is no pelvic lymphadenopathy evident. No aggressive osseous
lesion.

Transcapsular spread:  Absent

Seminal vesicle involvement: Absent

Neurovascular bundle involvement: Absent

Pelvic adenopathy: Absent

Bone metastasis: Absent

Other findings: Mild central gland hypertrophy
IMPRESSION: 1. Two lesions in the left peripheral zone concerning for high-grade
prostate carcinoma. One lesion in the left base and a second in the
left lateral mid gland.
2. No transcapsular spread or neurovascular involvement.
3. No pelvic lymphadenopathy.
4. Mild central gland hypertrophy.

## 2019-09-20 ENCOUNTER — Encounter: Payer: Self-pay | Admitting: Gastroenterology

## 2019-09-21 ENCOUNTER — Encounter: Payer: Self-pay | Admitting: Gastroenterology

## 2019-11-22 ENCOUNTER — Ambulatory Visit (AMBULATORY_SURGERY_CENTER): Payer: Self-pay | Admitting: *Deleted

## 2019-11-22 ENCOUNTER — Other Ambulatory Visit: Payer: Self-pay

## 2019-11-22 VITALS — Ht 72.0 in | Wt 223.0 lb

## 2019-11-22 DIAGNOSIS — Z86010 Personal history of colonic polyps: Secondary | ICD-10-CM

## 2019-11-22 NOTE — Progress Notes (Signed)
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 Please only bring one care partner. Please be aware that your care partner may wait in the car in the parking lot or if they feel like they will be too hot to wait in the car, they may wait in the lobby on the 4th floor. All care partners are required to wear a mask the entire time (we do not have any that we can provide them), they need to practice social distancing, and we will do a Covid check for all patient's and care partners when you arrive. Also we will check their temperature and your temperature. If the care partner waits in their car they need to stay in the parking lot the entire time and we will call them on their cell phone when the patient is ready for discharge so they can bring the car to the front of the building. Also all patient's will need to wear a mask into building.

## 2019-12-06 ENCOUNTER — Other Ambulatory Visit: Payer: Self-pay

## 2019-12-06 ENCOUNTER — Ambulatory Visit (AMBULATORY_SURGERY_CENTER): Payer: Medicare Other | Admitting: Gastroenterology

## 2019-12-06 ENCOUNTER — Encounter: Payer: Self-pay | Admitting: Gastroenterology

## 2019-12-06 VITALS — BP 122/57 | HR 44 | Temp 96.9°F | Resp 14 | Ht 72.0 in | Wt 223.0 lb

## 2019-12-06 DIAGNOSIS — Z1211 Encounter for screening for malignant neoplasm of colon: Secondary | ICD-10-CM | POA: Diagnosis not present

## 2019-12-06 DIAGNOSIS — D125 Benign neoplasm of sigmoid colon: Secondary | ICD-10-CM

## 2019-12-06 DIAGNOSIS — Z86010 Personal history of colonic polyps: Secondary | ICD-10-CM

## 2019-12-06 MED ORDER — SODIUM CHLORIDE 0.9 % IV SOLN: 500.0000 mL | Freq: Once | INTRAVENOUS | Status: DC

## 2019-12-06 NOTE — Patient Instructions (Signed)
HANDOUTS PROVIDED ON: POLYPS & DIVERTICULOSIS  The polyp removed today have been sent for pathology.  The results can take 1-3 weeks to receive.  When your next colonoscopy should occur will be based on the pathology results.    You may resume your previous diet and medication schedule.  Thank you for allowing [place] to care for you today!!!   YOU HAD AN ENDOSCOPIC PROCEDURE TODAY AT [facility]:   Refer to the procedure report that was given to you for any specific questions about what was found during the examination.  If the procedure report does not answer your questions, please call your gastroenterologist to clarify.  If you requested that your care partner not be given the details of your procedure findings, then the procedure report has been included in a sealed envelope for you to review at your convenience later.  YOU SHOULD EXPECT: Some feelings of bloating in the abdomen. Passage of more gas than usual.  Walking can help get rid of the air that was put into your GI tract during the procedure and reduce the bloating. If you had a lower endoscopy (such as a colonoscopy or flexible sigmoidoscopy) you may notice spotting of blood in your stool or on the toilet paper. If you underwent a bowel prep for your procedure, you may not have a normal bowel movement for a few days.  Please Note:  You might notice some irritation and congestion in your nose or some drainage.  This is from the oxygen used during your procedure.  There is no need for concern and it should clear up in a day or so.  SYMPTOMS TO REPORT IMMEDIATELY:   Following lower endoscopy (colonoscopy or flexible sigmoidoscopy):  Excessive amounts of blood in the stool  Significant tenderness or worsening of abdominal pains  Swelling of the abdomen that is new, acute  Fever of 100F or higher  For urgent or emergent issues, a gastroenterologist can be reached at any hour by calling [phone]. Do not use MyChart  messaging for urgent concerns.    DIET:  We do recommend a small meal at first, but then you may proceed to your regular diet.  Drink plenty of fluids but you should avoid alcoholic beverages for 24 hours.  ACTIVITY:  You should plan to take it easy for the rest of today and you should NOT DRIVE or use heavy machinery until tomorrow (because of the sedation medicines used during the test).    FOLLOW UP: Our staff will call the number listed on your records Thursday morning between 7:15 am & 8:15 am to check on you and address any questions or concerns that you may have regarding the information given to you following your procedure. If we do not reach you, we will leave a message.  We will attempt to reach you two times.  During this call, we will ask if you have developed any symptoms of COVID 19. If you develop any symptoms (ie: fever, flu-like symptoms, shortness of breath, cough etc.) before then, please call [phone].  If you test positive for Covid 19 in the 2 weeks post procedure, please call and report this information to [place].    If any biopsies were taken you will be contacted by phone or by letter within the next 1-3 weeks.  Please call us at [phone] if you have not heard about the biopsies in 3 weeks.    SIGNATURES/CONFIDENTIALITY: You and/or your care partner have signed paperwork  which will be entered into your electronic medical record.  These signatures attest to the fact that that the information above on your After Visit Summary has been reviewed and is understood.  Full responsibility of the confidentiality of this discharge information lies with you and/or your care-partner.

## 2019-12-06 NOTE — Op Note (Signed)
[facility] Patient Name: Larry Rasmussen Procedure Date: 12/06/2019 10:44 AM MRN: [id number] Endoscopist: Milus Banister , MD Age: 74 Referring MD:  Date of Birth: [birth year] Gender: Male Account #: [id number] Procedure:                Colonoscopy Indications:              Screening for colorectal malignant neoplasm Medicines:                Monitored Anesthesia Care Procedure:                Pre-Anesthesia Assessment:                           - Prior to the procedure, a History and Physical                            was performed, and patient medications and                            allergies were reviewed. The patient's tolerance of                            previous anesthesia was also reviewed. The risks                            and benefits of the procedure and the sedation                            options and risks were discussed with the patient.                            All questions were answered, and informed consent                            was obtained. Prior Anticoagulants: The patient has                            taken no previous anticoagulant or antiplatelet                            agents. ASA Grade Assessment: II - A patient with                            mild systemic disease. After reviewing the risks                            and benefits, the patient was deemed in                            satisfactory condition to undergo the procedure.                           After obtaining informed consent, the colonoscope  was passed under direct vision. Throughout the                            procedure, the patient's blood pressure, pulse, and                            oxygen saturations were monitored continuously. The                            Colonoscope was introduced through the anus and                            advanced to the the cecum, identified by                            appendiceal orifice  and ileocecal valve. The                            colonoscopy was performed without difficulty. The                            patient tolerated the procedure well. The quality                            of the bowel preparation was good. The ileocecal                            valve, appendiceal orifice, and rectum were                            photographed. Scope In: 10:53:05 AM Scope Out: 11:07:31 AM Scope Withdrawal Time: 0 hours 9 minutes 42 seconds  Total Procedure Duration: 0 hours 14 minutes 26 seconds  Findings:                 A 3 mm polyp was found in the sigmoid colon. The                            polyp was sessile. The polyp was removed with a                            cold snare. Resection and retrieval were complete.                           Multiple small and large-mouthed diverticula were                            found in the left colon.                           The exam was otherwise without abnormality on                            direct and retroflexion views. Complications:  No immediate complications. Estimated blood loss:                            None. Estimated Blood Loss:     Estimated blood loss: none. Impression:               - One 3 mm polyp in the sigmoid colon, removed with                            a cold snare. Resected and retrieved.                           - Diverticulosis in the left colon.                           - The examination was otherwise normal on direct                            and retroflexion views. Recommendation:           - Patient has a contact number available for                            emergencies. The signs and symptoms of potential                            delayed complications were discussed with the                            patient. Return to normal activities tomorrow.                            Written discharge instructions were provided to the                            patient.                            - Resume previous diet.                           - Continue present medications.                           - Await pathology results. Milus Banister, MD 12/06/2019 11:09:36 AM This report has been signed electronically.

## 2019-12-06 NOTE — Progress Notes (Signed)
Called to room to assist during endoscopic procedure.  Patient ID and intended procedure confirmed with present staff. Received instructions for my participation in the procedure from the performing physician.  

## 2019-12-06 NOTE — Progress Notes (Signed)
PT taken to PACU. Monitors in place. VSS. Report given to RN. 

## 2019-12-06 NOTE — Progress Notes (Signed)
Pt's states no medical or surgical changes since previsit or office visit. 

## 2019-12-08 ENCOUNTER — Telehealth: Payer: Self-pay

## 2019-12-08 NOTE — Telephone Encounter (Signed)
  Follow up Call-  Call back number 12/06/2019  Post procedure Call Back phone  # [phone]  Permission to leave phone message Yes  Some recent data might be hidden     Patient questions:  Do you have a fever, pain , or abdominal swelling? No. Pain Score  0 *  Have you tolerated food without any problems? Yes.    Have you been able to return to your normal activities? Yes.    Do you have any questions about your discharge instructions: Diet   No. Medications  No. Follow up visit  No.  Do you have questions or concerns about your Care? No.  Actions: * If pain score is 4 or above: No action needed, pain <4.  1. Have you developed a fever since your procedure? no  2.   Have you had an respiratory symptoms (SOB or cough) since your procedure? no  3.   Have you tested positive for COVID 19 since your procedure no  4.   Have you had any family members/close contacts diagnosed with the COVID 19 since your procedure?  no   If yes to any of these questions please route to Joylene John, RN and Joella Prince, RN

## 2019-12-13 ENCOUNTER — Encounter: Payer: Self-pay | Admitting: Gastroenterology
